# Patient Record
Sex: Male | Born: 1965 | Race: White | Hispanic: No | Smoking: Current every day smoker
Health system: Southern US, Community
[De-identification: ages and names within clinical notes are randomized; demographics above are authoritative.]

## PROBLEM LIST (undated history)

## (undated) DIAGNOSIS — J449 Chronic obstructive pulmonary disease, unspecified: Secondary | ICD-10-CM

## (undated) DIAGNOSIS — K439 Ventral hernia without obstruction or gangrene: Secondary | ICD-10-CM

## (undated) DIAGNOSIS — I10 Essential (primary) hypertension: Secondary | ICD-10-CM

## (undated) DIAGNOSIS — M549 Dorsalgia, unspecified: Secondary | ICD-10-CM

## (undated) DIAGNOSIS — G8929 Other chronic pain: Secondary | ICD-10-CM

## (undated) HISTORY — PX: HERNIA REPAIR: SHX51

---

## 2006-11-29 ENCOUNTER — Emergency Department: Payer: Self-pay

## 2009-04-21 ENCOUNTER — Emergency Department: Payer: Self-pay | Admitting: Emergency Medicine

## 2010-03-28 ENCOUNTER — Ambulatory Visit: Payer: Self-pay | Admitting: Family

## 2010-05-15 ENCOUNTER — Emergency Department: Payer: Self-pay | Admitting: Emergency Medicine

## 2014-09-19 ENCOUNTER — Emergency Department: Payer: Self-pay | Admitting: Emergency Medicine

## 2015-01-14 NOTE — Unmapped External Note (Signed)
 Duplicate

## 2015-01-14 NOTE — Unmapped External Note (Signed)
 Preoperative Diagnosis:    reducible initial inguinal hernia,  Bilateral  Postoperative Diagnosis:    Same, reducible initial inguinal hernia Procedure(s) Performed:    Laparoscopic inguinal hernia repair with prosthetic mesh (TAPP, transabdominal preperitoneal repair), Bilateral   Teaching Surgeon:   Alyce Phlegm, M.D.  Resident Surgeon:   Aimee Kim MD.   Assistants:  None Dictated  Anesthesia:    General.  Specimens:  None.  Estimated Blood Loss:   5 mL  Complications:    None.  Indications for Surgery:   This patient has symptomatic  Bilateral inguinal hernia. A laparoscopic  repair with prosthetic mesh was recommended.   The options of non-operative management, open repair, and laparoscopic repair were discussed with the patient.  The risks of infection, bleeding, recurrence, chronic pain and injury to the intra-abdominal structures were all explained to the patient.  The patient was given the opportunity to answer questions and they were answered.  The patient requested that we proceed with the operation.  Operative Findings: Inguinal hernia, Direct bilateral  Procedure:    A time out was performed upon the patient entering the room.  The correct patient, procedure, and side were verified with the consent and patient. After anesthesia was established, a Foley catheter was placed.  A sterile prep and drape of the abdomen and groin was performed. An incision was made at the umbilicus.  A 12 mm port was introduced using the Hasson technique.  Pneumoperitoneum was established.  A diagnostic laparosocopy revealed no intraabdominal abnormalities besides the hernias described above.  Two 5 mm ports were placed in the mid clavicular lines, taking care to avoid the epigastric.  The peritoneum was then incised on the Right.  The incision started at the ASIS, then was carried anterior and cephalad until at least 4 cm above the hernia defect.  It was carried medially to the median  umbilical fold.  A preperitoneal flap was then developed laterally until the hernia was reached.  A medial dissection was performed until the pubic tubercle and cooper's ligament was identified.  The hernia sac was then reduced and separated from the cord structures.  It was peeled down off of the cord structures until it was clear several centimeters below the internal ring.   A piece of Prolene soft mesh was then brought onto the field and cut to 4 inches by 6 inches.  A tacker was used to anchor the mesh to the posterior aspect of the anterior abdominal wall several centimeters above the defect.  It was also anchored to the pubic tubercle and Cooper's ligament.  A tack was also placed into the inguinal ligament lateral to the defect.   The peritoneum was then closed with absorbable tacks. This procedure was then repeated for the other side.  All trocars with withdrawn.  The fascia at the umbilicus was closed with interrupted 0 Vicryls.   A subcuticular closure and dermabond were used to close the skin.  All of his cord structures were identified and preserved during the case.     Teaching Surgeon Attestation:   I was present for the entire case.

## 2015-09-22 ENCOUNTER — Emergency Department
Admission: EM | Admit: 2015-09-22 | Discharge: 2015-09-22 | Disposition: A | Discharge status: Home / Self Care | Payer: Medicaid Other | Attending: Emergency Medicine | Admitting: Emergency Medicine

## 2015-09-22 ENCOUNTER — Encounter: Payer: Self-pay | Admitting: Emergency Medicine

## 2015-09-22 DIAGNOSIS — R200 Anesthesia of skin: Secondary | ICD-10-CM | POA: Diagnosis not present

## 2015-09-22 DIAGNOSIS — R292 Abnormal reflex: Secondary | ICD-10-CM | POA: Insufficient documentation

## 2015-09-22 DIAGNOSIS — M549 Dorsalgia, unspecified: Secondary | ICD-10-CM | POA: Diagnosis present

## 2015-09-22 DIAGNOSIS — M5489 Other dorsalgia: Secondary | ICD-10-CM | POA: Insufficient documentation

## 2015-09-22 DIAGNOSIS — F17210 Nicotine dependence, cigarettes, uncomplicated: Secondary | ICD-10-CM | POA: Diagnosis not present

## 2015-09-22 DIAGNOSIS — G8929 Other chronic pain: Secondary | ICD-10-CM | POA: Diagnosis not present

## 2015-09-22 DIAGNOSIS — M62838 Other muscle spasm: Secondary | ICD-10-CM | POA: Insufficient documentation

## 2015-09-22 HISTORY — DX: Other chronic pain: G89.29

## 2015-09-22 HISTORY — DX: Chronic obstructive pulmonary disease, unspecified: J44.9

## 2015-09-22 HISTORY — DX: Ventral hernia without obstruction or gangrene: K43.9

## 2015-09-22 HISTORY — DX: Dorsalgia, unspecified: M54.9

## 2015-09-22 MED ORDER — DIAZEPAM 5 MG/ML IJ SOLN
10.0000 mg | Freq: Once | INTRAMUSCULAR | Status: AC
  Administered 2015-09-22: 10 mg via INTRAMUSCULAR
  Filled 2015-09-22: qty 2

## 2015-09-22 MED ORDER — GUAIFENESIN-CODEINE 100-10 MG/5ML PO SOLN: 10.0000 mL | ORAL | Status: DC | PRN

## 2015-09-22 MED ORDER — AZITHROMYCIN 250 MG PO TABS: ORAL_TABLET | ORAL | Status: DC

## 2015-09-22 MED ORDER — HYDROMORPHONE HCL 1 MG/ML IJ SOLN
1.0000 mg | Freq: Once | INTRAMUSCULAR | Status: AC
  Administered 2015-09-22: 1 mg via INTRAMUSCULAR
  Filled 2015-09-22: qty 1

## 2015-09-22 MED ORDER — OXYCODONE-ACETAMINOPHEN 5-325 MG PO TABS: 1.0000 | ORAL_TABLET | ORAL | Status: DC | PRN

## 2015-09-22 MED ORDER — DIAZEPAM 5 MG PO TABS: 5.0000 mg | ORAL_TABLET | Freq: Three times a day (TID) | ORAL | Status: AC | PRN

## 2015-09-22 NOTE — ED Notes (Signed)
Discussed discharge instructions, prescriptions, and follow-up care with patient. No questions or concerns at this time. Pt stable at discharge.  

## 2015-09-22 NOTE — Discharge Instructions (Signed)
Chronic Back Pain  When back pain lasts longer than 3 months, it is called chronic back pain.People with chronic back pain often go through certain periods that are more intense (flare-ups).  CAUSES Chronic back pain can be caused by wear and tear (degeneration) on different structures in your back. These structures include:  The bones of your spine (vertebrae) and the joints surrounding your spinal cord and nerve roots (facets).  The strong, fibrous tissues that connect your vertebrae (ligaments). Degeneration of these structures may result in pressure on your nerves. This can lead to constant pain. HOME CARE INSTRUCTIONS  Avoid bending, heavy lifting, prolonged sitting, and activities which make the problem worse.  Take brief periods of rest throughout the day to reduce your pain. Lying down or standing usually is better than sitting while you are resting.  Take over-the-counter or prescription medicines only as directed by your caregiver. SEEK IMMEDIATE MEDICAL CARE IF:   You have weakness or numbness in one of your legs or feet.  You have trouble controlling your bladder or bowels.  You have nausea, vomiting, abdominal pain, shortness of breath, or fainting.   This information is not intended to replace advice given to you by your health care provider. Make sure you discuss any questions you have with your health care provider.   Document Released: 11/05/2004 Document Revised: 12/21/2011 Document Reviewed: 03/18/2015 Elsevier Interactive Patient Education 2016 [employer]  Lumbosacral Strain Lumbosacral strain is a strain of any of the parts that make up your lumbosacral vertebrae. Your lumbosacral vertebrae are the bones that make up the lower third of your backbone. Your lumbosacral vertebrae are held together by muscles and tough, fibrous tissue (ligaments).  CAUSES  A sudden blow to your back can cause lumbosacral strain. Also, anything that causes an excessive stretch  of the muscles in the low back can cause this strain. This is typically seen when people exert themselves strenuously, fall, lift heavy objects, bend, or crouch repeatedly. RISK FACTORS  Physically demanding work.  Participation in pushing or pulling sports or sports that require a sudden twist of the back (tennis, golf, baseball).  Weight lifting.  Excessive lower back curvature.  Forward-tilted pelvis.  Weak back or abdominal muscles or both.  Tight hamstrings. SIGNS AND SYMPTOMS  Lumbosacral strain may cause pain in the area of your injury or pain that moves (radiates) down your leg.  DIAGNOSIS Your health care provider can often diagnose lumbosacral strain through a physical exam. In some cases, you may need tests such as X-ray exams.  TREATMENT  Treatment for your lower back injury depends on many factors that your clinician will have to evaluate. However, most treatment will include the use of anti-inflammatory medicines. HOME CARE INSTRUCTIONS   Avoid hard physical activities (tennis, racquetball, waterskiing) if you are not in proper physical condition for it. This may aggravate or create problems.  If you have a back problem, avoid sports requiring sudden body movements. Swimming and walking are generally safer activities.  Maintain good posture.  Maintain a healthy weight.  For acute conditions, you may put ice on the injured area.  Put ice in a plastic bag.  Place a towel between your skin and the bag.  Leave the ice on for 20 minutes, 2-3 times a day.  When the low back starts healing, stretching and strengthening exercises may be recommended. SEEK MEDICAL CARE IF:  Your back pain is getting worse.  You experience severe back pain not relieved with medicines.  SEEK IMMEDIATE MEDICAL CARE IF:   You have numbness, tingling, weakness, or problems with the use of your arms or legs.  There is a change in bowel or bladder control.  You have increasing pain in  any area of the body, including your belly (abdomen).  You notice shortness of breath, dizziness, or feel faint.  You feel sick to your stomach (nauseous), are throwing up (vomiting), or become sweaty.  You notice discoloration of your toes or legs, or your feet get very cold. MAKE SURE YOU:   Understand these instructions.  Will watch your condition.  Will get help right away if you are not doing well or get worse.   This information is not intended to replace advice given to you by your health care provider. Make sure you discuss any questions you have with your health care provider.   Document Released: 07/08/2005 Document Revised: 10/19/2014 Document Reviewed: 05/17/2013 Elsevier Interactive Patient Education 2016 [employer]  Radicular Pain Radicular pain in either the arm or leg is usually from a bulging or herniated disk in the spine. A piece of the herniated disk may press against the nerves as the nerves exit the spine. This causes pain which is felt at the tips of the nerves down the arm or leg. Other causes of radicular pain may include:  Fractures.  Heart disease.  Cancer.  An abnormal and usually degenerative state of the nervous system or nerves (neuropathy). Diagnosis may require CT or MRI scanning to determine the primary cause.  Nerves that start at the neck (nerve roots) may cause radicular pain in the outer shoulder and arm. It can spread down to the thumb and fingers. The symptoms vary depending on which nerve root has been affected. In most cases radicular pain improves with conservative treatment. Neck problems may require physical therapy, a neck collar, or cervical traction. Treatment may take many weeks, and surgery may be considered if the symptoms do not improve.  Conservative treatment is also recommended for sciatica. Sciatica causes pain to radiate from the lower back or buttock area down the leg into the foot. Often there is a history of back  problems. Most patients with sciatica are better after 2 to 4 weeks of rest and other supportive care. Short term bed rest can reduce the disk pressure considerably. Sitting, however, is not a good position since this increases the pressure on the disk. You should avoid bending, lifting, and all other activities which make the problem worse. Traction can be used in severe cases. Surgery is usually reserved for patients who do not improve within the first months of treatment. Only take over-the-counter or prescription medicines for pain, discomfort, or fever as directed by your caregiver. Narcotics and muscle relaxants may help by relieving more severe pain and spasm and by providing mild sedation. Cold or massage can give significant relief. Spinal manipulation is not recommended. It can increase the degree of disc protrusion. Epidural steroid injections are often effective treatment for radicular pain. These injections deliver medicine to the spinal nerve in the space between the protective covering of the spinal cord and back bones (vertebrae). Your caregiver can give you more information about steroid injections. These injections are most effective when given within two weeks of the onset of pain.  You should see your caregiver for follow up care as recommended. A program for neck and back injury rehabilitation with stretching and strengthening exercises is an important part of management.  SEEK IMMEDIATE MEDICAL CARE IF:  You develop increased pain, weakness, or numbness in your arm or leg.  You develop difficulty with bladder or bowel control.  You develop abdominal pain.   This information is not intended to replace advice given to you by your health care provider. Make sure you discuss any questions you have with your health care provider.   Document Released: 11/05/2004 Document Revised: 10/19/2014 Document Reviewed: 04/24/2015 Elsevier Interactive Patient Education [employer]

## 2015-09-22 NOTE — ED Provider Notes (Signed)
[facility] Emergency Department Provider Note  ____________________________________________  Time seen: Approximately 12:09 PM  I have reviewed the triage vital signs and the nursing notes.   HISTORY  Chief Complaint Back Pain and Numbness    HPI David Sherman is a 49 y.o. male presents for evaluation of severe back pain with muscle spasms and numbness. Patient states that he has chronic pain and is expected to see the pain management doctor tomorrow. Feels like he cannot wait secondary to the spasms. Feels like his arms jerking his legs are jerking. Medical history significant for chronic back pain.   Past Medical History  Diagnosis Date  . COPD (chronic obstructive pulmonary disease) (HCC)   . Chronic back pain   . Hernia of abdominal wall     There are no active problems to display for this patient.   Past Surgical History  Procedure Laterality Date  . Hernia repair      Current Outpatient Rx  Name  Route  Sig  Dispense  Refill  . diazepam (VALIUM) 5 MG tablet   Oral   Take 1 tablet (5 mg total) by mouth every 8 (eight) hours as needed for muscle spasms.   21 tablet   0   . oxyCODONE-acetaminophen (ROXICET) 5-325 MG tablet   Oral   Take 1-2 tablets by mouth every 4 (four) hours as needed for severe pain.   15 tablet   0     Allergies Review of patient's allergies indicates no known allergies.  History reviewed. No pertinent family history.  Social History Social History  Substance Use Topics  . Smoking status: Current Every Day Smoker -- 0.50 packs/day    Types: Cigarettes  . Smokeless tobacco: Never Used  . Alcohol Use: Yes     Comment: occassionally    Review of Systems Constitutional: No fever/chills Eyes: No visual changes. ENT: No sore throat. Cardiovascular: Denies chest pain. Respiratory: Denies shortness of breath. Gastrointestinal: No abdominal pain.  No nausea, no vomiting.  No diarrhea.  No  constipation. Genitourinary: Negative for dysuria. Musculoskeletal: Positive for back pain. Positive for leg jerking and muscle spasms. Skin: Negative for rash. Neurological: Negative for headaches, focal weakness or numbness.  10-point ROS otherwise negative.  ____________________________________________   PHYSICAL EXAM:  VITAL SIGNS: ED Triage Vitals  Enc Vitals Group     BP 09/22/15 1113 138/93 mmHg     Pulse Rate 09/22/15 1113 79     Resp 09/22/15 1113 18     Temp 09/22/15 1113 98.1 F (36.7 C)     Temp Source 09/22/15 1113 Oral     SpO2 09/22/15 1113 97 %     Weight --      Height --      Head Cir --      Peak Flow --      Pain Score 09/22/15 1116 10     Pain Loc --      Pain Edu? --      Excl. in GC? --     Constitutional: Alert and oriented. Well appearing and in no acute distress. Eyes: Conjunctivae are normal. PERRL. EOMI. Head: Atraumatic. Nose: No congestion/rhinnorhea. Mouth/Throat: Mucous membranes are moist.  Oropharynx non-erythematous. Neck: No stridor.   Cardiovascular: Normal rate, regular rhythm. Grossly normal heart sounds.  Good peripheral circulation. Respiratory: Normal respiratory effort.  No retractions. Lungs CTAB. Gastrointestinal: Soft and nontender. No distention. No abdominal bruits. No CVA tenderness. Musculoskeletal: No lower extremity tenderness nor edema.  No joint effusions. Neurologic:  Normal speech and language. No gross focal neurologic deficits are appreciated. No gait instability. Skin:  Skin is warm, dry and intact. No rash noted. Psychiatric: Mood and affect are normal. Speech and behavior are normal.  ____________________________________________   LABS (all labs ordered are listed, but only abnormal results are displayed)  Labs Reviewed - No data to display ____________________________________________  RADIOLOGY  Deferred at this visit. ____________________________________________   PROCEDURES  Procedure(s)  performed: None  Critical Care performed: No  ____________________________________________   INITIAL IMPRESSION / ASSESSMENT AND PLAN / ED COURSE  Pertinent labs & imaging results that were available during my care of the patient were reviewed by me and considered in my medical decision making (see chart for details).  Acute musculoskeletal muscle spasms of his lower back and legs. Patient states significant improvement with him injection. Patient will follow up with pain management tomorrow as scheduled Rx given for Valium 5 mg 3 times a day. #21 patient voices no other emergency medical complaints at this time. ____________________________________________   FINAL CLINICAL IMPRESSION(S) / ED DIAGNOSES  Final diagnoses:  Muscle spasms of neck  Other back pain      KAHIAU SCHEWE, PA-C 09/22/15 1405  Phineas Semen, MD 09/22/15 [phone]

## 2015-09-22 NOTE — ED Notes (Addendum)
Pt here for back pain with muscle spasms and numbness in left arm.  This is chronic pain. Pt has appointment with pain MD tomorrow. Reports that he feels he cannot wait.

## 2015-10-23 ENCOUNTER — Other Ambulatory Visit: Payer: Self-pay | Admitting: Pediatrics

## 2015-10-23 DIAGNOSIS — R109 Unspecified abdominal pain: Secondary | ICD-10-CM

## 2015-10-28 ENCOUNTER — Ambulatory Visit
Admission: RE | Admit: 2015-10-28 | Discharge: 2015-10-28 | Disposition: A | Discharge status: Home / Self Care | Payer: Medicaid Other | Source: Ambulatory Visit | Attending: Pediatrics | Admitting: Pediatrics

## 2015-10-28 DIAGNOSIS — R109 Unspecified abdominal pain: Secondary | ICD-10-CM | POA: Diagnosis present

## 2015-11-13 ENCOUNTER — Other Ambulatory Visit: Payer: Self-pay | Admitting: Pediatrics

## 2015-11-14 ENCOUNTER — Other Ambulatory Visit: Payer: Self-pay | Admitting: Pediatrics

## 2015-11-14 DIAGNOSIS — F411 Generalized anxiety disorder: Secondary | ICD-10-CM

## 2015-11-14 DIAGNOSIS — R109 Unspecified abdominal pain: Secondary | ICD-10-CM

## 2015-12-03 ENCOUNTER — Ambulatory Visit: Admission: RE | Admit: 2015-12-03 | Payer: Medicaid Other | Source: Ambulatory Visit

## 2016-10-30 ENCOUNTER — Emergency Department: Payer: Medicaid Other

## 2016-10-30 ENCOUNTER — Emergency Department
Admission: EM | Admit: 2016-10-30 | Discharge: 2016-10-31 | Disposition: A | Discharge status: Home / Self Care | Payer: Medicaid Other | Attending: Emergency Medicine | Admitting: Emergency Medicine

## 2016-10-30 ENCOUNTER — Encounter: Payer: Self-pay | Admitting: Emergency Medicine

## 2016-10-30 DIAGNOSIS — F17210 Nicotine dependence, cigarettes, uncomplicated: Secondary | ICD-10-CM | POA: Insufficient documentation

## 2016-10-30 DIAGNOSIS — Y999 Unspecified external cause status: Secondary | ICD-10-CM | POA: Insufficient documentation

## 2016-10-30 DIAGNOSIS — W000XXA Fall on same level due to ice and snow, initial encounter: Secondary | ICD-10-CM | POA: Insufficient documentation

## 2016-10-30 DIAGNOSIS — Y929 Unspecified place or not applicable: Secondary | ICD-10-CM | POA: Diagnosis not present

## 2016-10-30 DIAGNOSIS — R251 Tremor, unspecified: Secondary | ICD-10-CM | POA: Diagnosis not present

## 2016-10-30 DIAGNOSIS — S0181XA Laceration without foreign body of other part of head, initial encounter: Secondary | ICD-10-CM

## 2016-10-30 DIAGNOSIS — Z79899 Other long term (current) drug therapy: Secondary | ICD-10-CM | POA: Insufficient documentation

## 2016-10-30 DIAGNOSIS — R55 Syncope and collapse: Secondary | ICD-10-CM

## 2016-10-30 DIAGNOSIS — S01112A Laceration without foreign body of left eyelid and periocular area, initial encounter: Secondary | ICD-10-CM | POA: Diagnosis not present

## 2016-10-30 DIAGNOSIS — J449 Chronic obstructive pulmonary disease, unspecified: Secondary | ICD-10-CM | POA: Insufficient documentation

## 2016-10-30 DIAGNOSIS — Y939 Activity, unspecified: Secondary | ICD-10-CM | POA: Insufficient documentation

## 2016-10-30 DIAGNOSIS — S0990XA Unspecified injury of head, initial encounter: Secondary | ICD-10-CM | POA: Diagnosis present

## 2016-10-30 LAB — CBC
HCT: 45.4 % (ref 40.0–52.0)
Hemoglobin: 15.7 g/dL (ref 13.0–18.0)
MCH: 33.6 pg (ref 26.0–34.0)
MCHC: 34.5 g/dL (ref 32.0–36.0)
MCV: 97.4 fL (ref 80.0–100.0)
Platelets: 231 10*3/uL (ref 150–440)
RBC: 4.66 MIL/uL (ref 4.40–5.90)
RDW: 13.5 % (ref 11.5–14.5)
WBC: 6.9 10*3/uL (ref 3.8–10.6)

## 2016-10-30 LAB — CK: Total CK: 87 U/L (ref 49–397)

## 2016-10-30 LAB — URINALYSIS, COMPLETE (UACMP) WITH MICROSCOPIC
Bacteria, UA: NONE SEEN
Bilirubin Urine: NEGATIVE
GLUCOSE, UA: NEGATIVE mg/dL
HGB URINE DIPSTICK: NEGATIVE
Ketones, ur: 5 mg/dL — AB
Leukocytes, UA: NEGATIVE
NITRITE: NEGATIVE
Protein, ur: NEGATIVE mg/dL
SPECIFIC GRAVITY, URINE: 1.027 (ref 1.005–1.030)
pH: 5 (ref 5.0–8.0)

## 2016-10-30 LAB — BASIC METABOLIC PANEL
ANION GAP: 4 — AB (ref 5–15)
BUN: 14 mg/dL (ref 6–20)
CALCIUM: 9.2 mg/dL (ref 8.9–10.3)
CO2: 29 mmol/L (ref 22–32)
CREATININE: 1.04 mg/dL (ref 0.61–1.24)
Chloride: 106 mmol/L (ref 101–111)
GFR calc Af Amer: >60 mL/min (ref >60)
GLUCOSE: 106 mg/dL — AB (ref 65–99)
Potassium: 4.6 mmol/L (ref 3.5–5.1)
Sodium: 139 mmol/L (ref 135–145)

## 2016-10-30 LAB — TROPONIN I: Troponin I: <0.03 ng/mL (ref <0.03)

## 2016-10-30 LAB — ETHANOL

## 2016-10-30 MED ORDER — THIAMINE HCL 100 MG/ML IJ SOLN
Freq: Once | INTRAVENOUS | Status: AC
  Administered 2016-10-30: 23:00:00 via INTRAVENOUS
  Filled 2016-10-30 (×2): qty 1000

## 2016-10-30 MED ORDER — BACITRACIN ZINC 500 UNIT/GM EX OINT
TOPICAL_OINTMENT | CUTANEOUS | Status: AC
  Filled 2016-10-30: qty 0.9

## 2016-10-30 MED ORDER — LORAZEPAM 2 MG/ML IJ SOLN
1.0000 mg | Freq: Once | INTRAMUSCULAR | Status: AC
  Administered 2016-10-30: 1 mg via INTRAVENOUS
  Filled 2016-10-30: qty 1

## 2016-10-30 NOTE — ED Provider Notes (Signed)
[facility] Emergency Department Provider Note  ____________________________________________   First MD Initiated Contact with Patient 10/30/16 2112     (approximate)  I have reviewed the triage vital signs and the nursing notes.   HISTORY  Chief Complaint Loss of Consciousness and Hand Pain   HPI David Sherman is a 51 y.o. male with a history of chronic back pain as well as COPD who is presenting to the emergency department with aching and shaking to his bilateral hands.  He said that 2 days ago he went out in the snow and slipped and fell. He said that he came back inside and was feeling nauseous and then felt lightheaded and fell forward. He denies losing consciousness. He says that he is coming to the emergency department today because ever since that he had this episode he has felt shaking in his hands along with cramping. He says that when he fell, he fell onto the wall and snapped his head backward and felt like he may have broken his neck at that time as well. He does not report any weakness. Says that he has had no MRI with arthritis in the past in his neck.  Although the triage note says that he felt the patient denies any falls today. He says that he also stated lacerations 2 days ago but did not have them addressed. He has placed tape on them. He says that he has had a tetanus shot within the last 2 years.   Past Medical History:  Diagnosis Date  . Chronic back pain   . COPD (chronic obstructive pulmonary disease) (HCC)   . Hernia of abdominal wall     There are no active problems to display for this patient.   Past Surgical History:  Procedure Laterality Date  . HERNIA REPAIR      Prior to Admission medications   Medication Sig Start Date End Date Taking? Authorizing Provider  oxyCODONE-acetaminophen (ROXICET) 5-325 MG tablet Take 1-2 tablets by mouth every 4 (four) hours as needed for severe pain. 09/22/15   Evangeline Dakin, PA-C      Allergies Patient has no known allergies.  No family history on file.  Social History Social History  Substance Use Topics  . Smoking status: Current Every Day Smoker    Packs/day: 0.50    Types: Cigarettes  . Smokeless tobacco: Never Used  . Alcohol use Yes     Comment: occassionally    Review of Systems Constitutional: No fever/chills Eyes: No visual changes. ENT: No sore throat. Cardiovascular: Denies chest pain. Respiratory: Denies shortness of breath. Gastrointestinal: No abdominal pain.  no vomiting.  No diarrhea.  No constipation. Genitourinary: Negative for dysuria. Musculoskeletal: Negative for back pain. Skin: Negative for rash. Neurological: Negative for headaches, focal weakness or numbness.  10-point ROS otherwise negative.  ____________________________________________   PHYSICAL EXAM:  VITAL SIGNS: ED Triage Vitals [10/30/16 1631]  Enc Vitals Group     BP (!) 155/102     Pulse Rate 74     Resp 16     Temp 98.4 F (36.9 C)     Temp Source Oral     SpO2 99 %     Weight 140 lb (63.5 kg)     Height 6\' 1"  (1.854 m)     Head Circumference      Peak Flow      Pain Score 8     Pain Loc      Pain Edu?  Excl. in GC?     Constitutional: Alert and oriented. Well appearing and in no acute distress. Eyes: Conjunctivae are normal. PERRL. EOMI. Head: Laceration over the medial left eyebrow which is curvilinear 3 cm. There is no active bleeding. No starting induration or pus. No tenderness palpation or depressions. One set meter laceration to the right inferior chin without any bleeding, induration or pus. No tenderness to palpation. No trismus. Nose: No congestion/rhinnorhea. Mouth/Throat: Mucous membranes are moist.  Neck: No stridor.  No tenderness to the posterior cervical spine. No deformity or step-off. Cardiovascular: Normal rate, regular rhythm. Grossly normal heart sounds.   Respiratory: Normal respiratory effort.  No retractions. Lungs  CTAB. Gastrointestinal: Soft and nontender. No distention.  Musculoskeletal: No lower extremity tenderness nor edema.  No joint effusions. Neurologic:  Normal speech and language. No gross focal neurologic deficits are appreciated. Sensation is intact to bilateral upper extremity is. 5/5 strength to the bilateral upper as well as lower  Extremities. No sensory deficits. No deformities. Mild tremor. Skin:  Skin is warm, dry and intact. No rash noted. Psychiatric: Mood and affect are normal. Speech and behavior are normal.  ____________________________________________   LABS (all labs ordered are listed, but only abnormal results are displayed)  Labs Reviewed  BASIC METABOLIC PANEL - Abnormal; Notable for the following:       Result Value   Glucose, Bld 106 (*)    Anion gap 4 (*)    All other components within normal limits  URINALYSIS, COMPLETE (UACMP) WITH MICROSCOPIC - Abnormal; Notable for the following:    Color, Urine YELLOW (*)    APPearance CLEAR (*)    Ketones, ur 5 (*)    Squamous Epithelial / LPF 0-5 (*)    All other components within normal limits  CBC  TROPONIN I  CK  ETHANOL  CBG MONITORING, ED   ____________________________________________  EKG  ED ECG REPORT I, Arelia Longest, the attending physician, personally viewed and interpreted this ECG.   Date: 10/31/2016  EKG Time: 1635  Rate: 70  Rhythm: normal sinus rhythm  Axis: Normal  Intervals:none  ST&T Change: No ST segment elevation or depression. No abnormal T-wave inversion.  ____________________________________________  RADIOLOGY  CT Cervical Spine Wo Contrast (Accession [id number]) (Order [id number])  Imaging  Date: 10/30/2016 Department: [facility] EMERGENCY DEPARTMENT Released By/Authorizing: Myrna Blazer, MD (auto-released)  Exam Information   Status Exam Begun  Exam Ended   Final [99] 10/30/2016 10:02 PM 10/30/2016 10:06 PM  PACS Images   Show images  for CT Cervical Spine Wo Contrast  Study Result   CLINICAL DATA:  Patient fell in shower as well as outside in the snow. No  EXAM: CT HEAD WITHOUT CONTRAST  CT CERVICAL SPINE WITHOUT CONTRAST  TECHNIQUE: Multidetector CT imaging of the head and cervical spine was performed following the standard protocol without intravenous contrast. Multiplanar CT image reconstructions of the cervical spine were also generated.  COMPARISON:  MRI cervical spine 03/16/2014  FINDINGS: CT HEAD FINDINGS  Brain: Mild superficial atrophy. No acute intracranial hemorrhage, midline shift or edema. No intra-axial mass nor extra-axial fluid collections.  Vascular: Minimal carotid siphon calcifications bilaterally.  Skull: No skull fracture nor bone destruction.  Sinuses/Orbits: Partial opacification of the right mastoids. Clear left mastoids. Paranasal sinuses are unremarkable. Orbits are symmetric in appearance.  Other: None  CT CERVICAL SPINE FINDINGS  Alignment: Straightening of cervical lordosis. Intact craniocervical relationship and atlantodental interval.  Skull base and vertebrae: No  fracture the skullbase nor vertebral body fracture. Alignment of the facets bilaterally. Osteophytes are seen from C3 through C6 anteriorly and posteriorly at C4-5. There is uncovertebral joint hypertrophy and osteoarthritis at C4-5 and C5-6 bilaterally. Mild levoconvex curvature of the cervical spine is seen.  Soft tissues and spinal canal: No prevertebral soft tissue swelling. No intraspinal hematoma.  Disc levels:  C2-C3:  Negative  C3-C4:  Negative  C4-C5: Central to right-sided eccentric mild-to-moderate disc bulge with osteophytes. Moderate right and mild left foraminal stenosis secondary to uncovertebral joint spurring and endplate osteophytes.  C5-C6: Mild disc bulge without focal disc herniation. Mild-to-moderate bilateral neural foraminal encroachment  from uncovertebral spurring.  C6-C7:  Negative  C7-T1:  Negative  T1-2: Negative  T2-3: Negative  Upper chest: Minimal central line emphysema. Probable pleuroparenchymal areas of scarring left-greater-than-right.  Other: None  IMPRESSION: No acute intracranial abnormality.  No acute cervical spine fracture. C4-5 mild to moderate broad-based central to slightly right-sided disc bulge. C5-6 mild disc bulge. Associated neural foraminal encroachment as above. 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   Electronically Signed   By: Tollie Ethavid  Kwon M.D.   On: 10/30/2016 22:36     ____________________________________________   PROCEDURES  Procedure(s) performed:   Procedures  Critical Care performed:   ____________________________________________   INITIAL IMPRESSION / ASSESSMENT AND PLAN / ED COURSE  Pertinent labs & imaging results that were available during my care of the patient were reviewed by me and considered in my medical decision making (see chart for details).  ----------------------------------------- 12:06 AM on 10/31/2016 -----------------------------------------  Patient with reassuring workup. Feels improved after fluids and Ativan. Patient given banana bag. Says that he does not drink every day. No cervical spine fracture. Arthritic changes as well as mild disc bulges. Reviewed his images with the patient. He will follow-up with his primary care doctor. Will be discharged home. Sounds like he had a single episode secondary to vagal response from nausea. He sustained lacerations which we would, if presenting under 24 hours, would've sutured. Instead, his lacerations were cleaned. Bacitracin was applied. And they were Steri-Stripped. Unclear cause of the tremor. Patient says he only drinks occasionally. 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 Due to the patient's neck pain as well as upper extremity symptoms he will be given a  prescription for ipratropium, 600 mg, every 6 over the next 3 days. He has no sensory deficits. No strength deficits. Do not feel that MRI is indicated at this time.      ____________________________________________   FINAL CLINICAL IMPRESSION(S) / ED DIAGNOSES  Near-syncope. Tremor. Facial lacerations. Nausea.    NEW MEDICATIONS STARTED DURING THIS VISIT:  New Prescriptions   No medications on file     Note:  This document was prepared using Dragon voice recognition software and may include unintentional dictation errors.    Myrna Blazeravid Matthew Tilley Faeth, MD 10/31/16 [id number]

## 2016-10-30 NOTE — ED Triage Notes (Signed)
Pt reports falling today in snow and then fallling today in shower. Pt with chronic back pain.

## 2016-10-30 NOTE — ED Triage Notes (Signed)
Larey SeatFell a couple of days ago.  States felt dizzy after trying to vomit and fell forward.  Today c/o hand pain and laceration to right forehead, from 2 days ago.

## 2016-10-31 MED ORDER — IBUPROFEN 600 MG PO TABS: 600.0000 mg | ORAL_TABLET | Freq: Four times a day (QID) | ORAL | 0 refills | Status: DC

## 2019-03-10 ENCOUNTER — Emergency Department: Payer: Medicaid Other

## 2019-03-10 ENCOUNTER — Other Ambulatory Visit: Payer: Self-pay

## 2019-03-10 ENCOUNTER — Observation Stay
Admission: EM | Admit: 2019-03-10 | Discharge: 2019-03-12 | Disposition: A | Discharge status: Home / Self Care | Payer: Medicaid Other | Attending: Internal Medicine | Admitting: Internal Medicine

## 2019-03-10 ENCOUNTER — Encounter: Payer: Self-pay | Admitting: Emergency Medicine

## 2019-03-10 DIAGNOSIS — J449 Chronic obstructive pulmonary disease, unspecified: Secondary | ICD-10-CM | POA: Insufficient documentation

## 2019-03-10 DIAGNOSIS — F17210 Nicotine dependence, cigarettes, uncomplicated: Secondary | ICD-10-CM | POA: Diagnosis not present

## 2019-03-10 DIAGNOSIS — M6281 Muscle weakness (generalized): Secondary | ICD-10-CM | POA: Insufficient documentation

## 2019-03-10 DIAGNOSIS — R296 Repeated falls: Secondary | ICD-10-CM | POA: Diagnosis not present

## 2019-03-10 DIAGNOSIS — R001 Bradycardia, unspecified: Secondary | ICD-10-CM | POA: Diagnosis not present

## 2019-03-10 DIAGNOSIS — Z7951 Long term (current) use of inhaled steroids: Secondary | ICD-10-CM | POA: Insufficient documentation

## 2019-03-10 DIAGNOSIS — F329 Major depressive disorder, single episode, unspecified: Secondary | ICD-10-CM | POA: Insufficient documentation

## 2019-03-10 DIAGNOSIS — K219 Gastro-esophageal reflux disease without esophagitis: Secondary | ICD-10-CM | POA: Diagnosis not present

## 2019-03-10 DIAGNOSIS — G8929 Other chronic pain: Secondary | ICD-10-CM | POA: Insufficient documentation

## 2019-03-10 DIAGNOSIS — I10 Essential (primary) hypertension: Secondary | ICD-10-CM | POA: Diagnosis not present

## 2019-03-10 DIAGNOSIS — E876 Hypokalemia: Secondary | ICD-10-CM | POA: Insufficient documentation

## 2019-03-10 DIAGNOSIS — Z1159 Encounter for screening for other viral diseases: Secondary | ICD-10-CM | POA: Diagnosis not present

## 2019-03-10 DIAGNOSIS — Z79899 Other long term (current) drug therapy: Secondary | ICD-10-CM | POA: Insufficient documentation

## 2019-03-10 DIAGNOSIS — R2689 Other abnormalities of gait and mobility: Secondary | ICD-10-CM | POA: Diagnosis not present

## 2019-03-10 DIAGNOSIS — I959 Hypotension, unspecified: Principal | ICD-10-CM

## 2019-03-10 DIAGNOSIS — R197 Diarrhea, unspecified: Secondary | ICD-10-CM | POA: Insufficient documentation

## 2019-03-10 DIAGNOSIS — R7989 Other specified abnormal findings of blood chemistry: Secondary | ICD-10-CM

## 2019-03-10 HISTORY — DX: Essential (primary) hypertension: I10

## 2019-03-10 LAB — LACTIC ACID, PLASMA
Lactic Acid, Venous: 3.9 mmol/L (ref 0.5–1.9)
Lactic Acid, Venous: 2.1 mmol/L (ref 0.5–1.9)
Lactic Acid, Venous: 1.3 mmol/L (ref 0.5–1.9)

## 2019-03-10 LAB — CBC WITH DIFFERENTIAL/PLATELET
Abs Immature Granulocytes: 0.02 10*3/uL (ref 0.00–0.07)
Basophils Absolute: 0 10*3/uL (ref 0.0–0.1)
Basophils Relative: 1 %
Eosinophils Absolute: 0.2 10*3/uL (ref 0.0–0.5)
Eosinophils Relative: 4 %
HCT: 34.7 % — ABNORMAL LOW (ref 39.0–52.0)
Hemoglobin: 12.6 g/dL — ABNORMAL LOW (ref 13.0–17.0)
Immature Granulocytes: 0 %
Lymphocytes Relative: 45 %
Lymphs Abs: 2.2 10*3/uL (ref 0.7–4.0)
MCH: 36.3 pg — ABNORMAL HIGH (ref 26.0–34.0)
MCHC: 36.3 g/dL — ABNORMAL HIGH (ref 30.0–36.0)
MCV: 100 fL (ref 80.0–100.0)
Monocytes Absolute: 0.8 10*3/uL (ref 0.1–1.0)
Monocytes Relative: 15 %
Neutro Abs: 1.7 10*3/uL (ref 1.7–7.7)
Neutrophils Relative %: 35 %
Platelets: 173 10*3/uL (ref 150–400)
RBC: 3.47 MIL/uL — ABNORMAL LOW (ref 4.22–5.81)
RDW: 12.4 % (ref 11.5–15.5)
WBC: 5 10*3/uL (ref 4.0–10.5)
nRBC: 0 % (ref 0.0–0.2)

## 2019-03-10 LAB — COMPREHENSIVE METABOLIC PANEL
ALT: 32 U/L (ref 0–44)
AST: 59 U/L — ABNORMAL HIGH (ref 15–41)
Albumin: 2.8 g/dL — ABNORMAL LOW (ref 3.5–5.0)
Alkaline Phosphatase: 81 U/L (ref 38–126)
Anion gap: 10 (ref 5–15)
BUN: 7 mg/dL (ref 6–20)
CO2: 23 mmol/L (ref 22–32)
Calcium: 8 mg/dL — ABNORMAL LOW (ref 8.9–10.3)
Chloride: 101 mmol/L (ref 98–111)
Creatinine, Ser: 0.97 mg/dL (ref 0.61–1.24)
GFR calc Af Amer: >60 mL/min (ref >60)
GFR calc non Af Amer: >60 mL/min (ref >60)
Glucose, Bld: 74 mg/dL (ref 70–99)
Potassium: 3.8 mmol/L (ref 3.5–5.1)
Sodium: 134 mmol/L — ABNORMAL LOW (ref 135–145)
Total Bilirubin: 0.7 mg/dL (ref 0.3–1.2)
Total Protein: 5.3 g/dL — ABNORMAL LOW (ref 6.5–8.1)

## 2019-03-10 LAB — URINALYSIS, COMPLETE (UACMP) WITH MICROSCOPIC
Bilirubin Urine: NEGATIVE
Glucose, UA: NEGATIVE mg/dL
Hgb urine dipstick: NEGATIVE
Ketones, ur: NEGATIVE mg/dL
Leukocytes,Ua: NEGATIVE
Nitrite: NEGATIVE
Protein, ur: NEGATIVE mg/dL
Specific Gravity, Urine: 1.006 (ref 1.005–1.030)
pH: 6 (ref 5.0–8.0)

## 2019-03-10 LAB — TROPONIN I
Troponin I: <0.03 ng/mL (ref <0.03)
Troponin I: <0.03 ng/mL (ref <0.03)

## 2019-03-10 LAB — SARS CORONAVIRUS 2 BY RT PCR (HOSPITAL ORDER, PERFORMED IN ~~LOC~~ HOSPITAL LAB): SARS Coronavirus 2: NEGATIVE

## 2019-03-10 MED ORDER — ENOXAPARIN SODIUM 40 MG/0.4ML ~~LOC~~ SOLN
40.0000 mg | SUBCUTANEOUS | Status: DC
  Administered 2019-03-10 – 2019-03-11 (×2): 40 mg via SUBCUTANEOUS
  Filled 2019-03-10 (×2): qty 0.4

## 2019-03-10 MED ORDER — ONDANSETRON HCL 4 MG/2ML IJ SOLN: 4.0000 mg | Freq: Four times a day (QID) | INTRAMUSCULAR | Status: DC | PRN

## 2019-03-10 MED ORDER — SODIUM CHLORIDE 0.9 % IV BOLUS
1000.0000 mL | Freq: Once | INTRAVENOUS | Status: AC
  Administered 2019-03-10: 1000 mL via INTRAVENOUS

## 2019-03-10 MED ORDER — CITALOPRAM HYDROBROMIDE 20 MG PO TABS
20.0000 mg | ORAL_TABLET | Freq: Every day | ORAL | Status: DC
  Administered 2019-03-11 – 2019-03-12 (×2): 20 mg via ORAL
  Filled 2019-03-10 (×2): qty 1

## 2019-03-10 MED ORDER — MOMETASONE FURO-FORMOTEROL FUM 200-5 MCG/ACT IN AERO
2.0000 | INHALATION_SPRAY | Freq: Two times a day (BID) | RESPIRATORY_TRACT | Status: DC
  Administered 2019-03-10 – 2019-03-12 (×4): 2 via RESPIRATORY_TRACT
  Filled 2019-03-10: qty 8.8

## 2019-03-10 MED ORDER — ONDANSETRON HCL 4 MG PO TABS: 4.0000 mg | ORAL_TABLET | Freq: Four times a day (QID) | ORAL | Status: DC | PRN

## 2019-03-10 MED ORDER — SODIUM CHLORIDE 0.9 % IV BOLUS
1000.0000 mL | Freq: Once | INTRAVENOUS | Status: AC
  Administered 2019-03-10: 16:00:00 1000 mL via INTRAVENOUS

## 2019-03-10 MED ORDER — ACETAMINOPHEN 325 MG PO TABS
650.0000 mg | ORAL_TABLET | Freq: Four times a day (QID) | ORAL | Status: DC | PRN
  Administered 2019-03-11: 650 mg via ORAL
  Filled 2019-03-10: qty 2

## 2019-03-10 MED ORDER — PANTOPRAZOLE SODIUM 40 MG PO TBEC
80.0000 mg | DELAYED_RELEASE_TABLET | Freq: Every day | ORAL | Status: DC
  Administered 2019-03-11 – 2019-03-12 (×2): 80 mg via ORAL
  Filled 2019-03-10 (×2): qty 2

## 2019-03-10 MED ORDER — SODIUM CHLORIDE 0.9 % IV SOLN
INTRAVENOUS | Status: DC
  Administered 2019-03-10 – 2019-03-12 (×4): via INTRAVENOUS

## 2019-03-10 MED ORDER — ALBUTEROL SULFATE (2.5 MG/3ML) 0.083% IN NEBU: 2.5000 mg | INHALATION_SOLUTION | RESPIRATORY_TRACT | Status: DC | PRN

## 2019-03-10 MED ORDER — ACETAMINOPHEN 650 MG RE SUPP: 650.0000 mg | Freq: Four times a day (QID) | RECTAL | Status: DC | PRN

## 2019-03-10 NOTE — H&P (Signed)
[employer] -  at [facility]   PATIENT NAME: David Sherman    MR#:  [id number]  DATE OF BIRTH:  11/05/1965  DATE OF ADMISSION:  03/10/2019  PRIMARY CARE PHYSICIAN: Orene Desanctis, MD   REQUESTING/REFERRING PHYSICIAN: Dorothea Glassman, MD  CHIEF COMPLAINT:   Chief Complaint  Patient presents with  . Weakness  . Hypotension    HISTORY OF PRESENT ILLNESS:  David Sherman  is a 53 y.o. male with a known history of COPD and hypertension who presented to the ED with generalized weakness and lightheadedness.  Patient states that starting this morning, he was unable to stand up without becoming very dizzy and feeling like he was going to pass out.  He also endorses generalized weakness.  He denies any chest pain or shortness of breath.  He endorses 5-6 episodes of diarrhea daily since April when he broke his jaw.  He denies any nausea, vomiting, or abdominal pain.  He denies any fevers or chills.  In the ED, he was noted to be hypotensive with BPs as low as 81/50.  He was also bradycardic with heart rates in the mid 50s.  Labs were significant for lactic acid 3.9.  Hemoglobin 12.6.  Chest x-ray negative.  Patient was given 2 L normal saline bolus with improvement in blood pressures.  Hospitalists were called for admission.  PAST MEDICAL HISTORY:   Past Medical History:  Diagnosis Date  . Chronic back pain   . COPD (chronic obstructive pulmonary disease) (HCC)   . Hernia of abdominal wall   . Hypertension     PAST SURGICAL HISTORY:   Past Surgical History:  Procedure Laterality Date  . HERNIA REPAIR      SOCIAL HISTORY:   Social History   Tobacco Use  . Smoking status: Current Every Day Smoker    Packs/day: 0.50    Types: Cigarettes  . Smokeless tobacco: Never Used  Substance Use Topics  . Alcohol use: Yes    Comment: occassionally    FAMILY HISTORY:  Father-heart disease  DRUG ALLERGIES:  No Known Allergies  REVIEW OF SYSTEMS:    Review of Systems  Constitutional: Negative for chills and fever.  HENT: Negative for congestion and sore throat.   Eyes: Negative for blurred vision and double vision.  Respiratory: Negative for cough and shortness of breath.   Cardiovascular: Negative for chest pain and palpitations.  Gastrointestinal: Positive for abdominal pain. Negative for blood in stool, melena, nausea and vomiting.  Genitourinary: Negative for dysuria and urgency.  Musculoskeletal: Negative for back pain and neck pain.  Neurological: Positive for dizziness and weakness. Negative for tingling, speech change, focal weakness and headaches.  Psychiatric/Behavioral: Negative for depression. The patient is not nervous/anxious.     MEDICATIONS AT HOME:   Prior to Admission medications   Medication Sig Start Date End Date Taking? Authorizing Provider  albuterol (VENTOLIN HFA) 108 (90 Base) MCG/ACT inhaler Inhale 2 puffs into the lungs every 4 (four) hours as needed for wheezing.   Yes [provider]  ascorbic acid (VITAMIN C) 500 MG tablet Take 1,000 mg by mouth daily. 03/03/19 03/02/20 Yes [provider]  budesonide-formoterol (SYMBICORT) 160-4.5 MCG/ACT inhaler Inhale 2 puffs into the lungs 2 (two) times a day. 11/15/18 11/15/19 Yes [provider]  citalopram (CELEXA) 20 MG tablet Take 20 mg by mouth daily. 01/03/19  Yes [provider]  lisinopril (ZESTRIL) 10 MG tablet Take 10 mg by mouth daily.   Yes [provider]  omeprazole (PRILOSEC) 40 MG capsule Take 40 mg by mouth daily.   Yes [provider]  propranolol (INDERAL) 20 MG tablet Take 20 mg by mouth 4 (four) times daily as needed for anxiety or tremors. 03/03/19 03/02/20 Yes [provider]  rOPINIRole (REQUIP) 0.5 MG tablet Take 0.5 mg by mouth 3 (three) times daily. 02/02/19  Yes [provider]  tiZANidine (ZANAFLEX) 4 MG tablet Take 2-4 mg by mouth 3 (three) times daily as needed for muscle  spasms.   Yes [provider]      VITAL SIGNS:  Blood pressure 90/60, pulse (!) 55, temperature 97.6 F (36.4 C), temperature source Oral, resp. rate 16, weight 63.5 kg, SpO2 98 %.  PHYSICAL EXAMINATION:  Physical Exam  GENERAL:  53 y.o.-year-old patient lying in the bed with no acute distress.  EYES: Pupils equal, round, reactive to light and accommodation. No scleral icterus. Extraocular muscles intact.  HEENT: Head atraumatic, normocephalic. Oropharynx and nasopharynx clear.  NECK:  Supple, no jugular venous distention. No thyroid enlargement, no tenderness.  LUNGS: Normal breath sounds bilaterally, no wheezing, rales,rhonchi or crepitation. No use of accessory muscles of respiration.  CARDIOVASCULAR: Bradycardic, regular rhythm, S1, S2 normal. No murmurs, rubs, or gallops.  ABDOMEN: Soft, nontender, nondistended. Bowel sounds present. No organomegaly or mass.  EXTREMITIES: No pedal edema, cyanosis, or clubbing.  NEUROLOGIC: Cranial nerves II through XII are intact. + Global weakness. Sensation intact. Gait not checked.  PSYCHIATRIC: The patient is alert and oriented x 3.  SKIN: No obvious rash, lesion, or ulcer.   LABORATORY PANEL:   CBC Recent Labs  Lab 03/10/19 1144  WBC 5.0  HGB 12.6*  HCT 34.7*  PLT 173   ------------------------------------------------------------------------------------------------------------------  Chemistries  Recent Labs  Lab 03/10/19 1144  NA 134*  K 3.8  CL 101  CO2 23  GLUCOSE 74  BUN 7  CREATININE 0.97  CALCIUM 8.0*  AST 59*  ALT 32  ALKPHOS 81  BILITOT 0.7   ------------------------------------------------------------------------------------------------------------------  Cardiac Enzymes Recent Labs  Lab 03/10/19 1144  TROPONINI <0.03   ------------------------------------------------------------------------------------------------------------------  RADIOLOGY:  Dg Chest Portable 1 View  Result Date:  03/10/2019 CLINICAL DATA:  Generalized weakness. EXAM: PORTABLE CHEST 1 VIEW COMPARISON:  06/06/2014. FINDINGS: Mediastinum hilar structures normal. Lungs are clear. Calcified pulmonary nodules consistent with granulomas. Heart size normal. No pleural effusion or pneumothorax. Degenerative change thoracic spine. IMPRESSION: No acute cardiopulmonary disease. Electronically Signed   By: Maisie Fus  Register   On: 03/10/2019 12:42      IMPRESSION AND PLAN:   Hypotension- likely due to dehydration in the setting of diarrhea as well as recently starting propranolol 4 times daily 1 week ago for tremor/anxiety. Also recently started on requip ~4 weeks ago. -s/p 2L NS bolus in the ED. Will give another bolus and start on IV fluids. -Hold propranolol, Requip, lisinopril, and zanaflex -Check orthostatic vitals tomorrow morning -Obtain ECHO (KC to read) -PT consult  Symptomatic bradycardia- likely due to recently starting propranolol -Stop propranolol -Check TSH -Cardiac monitoring  Lactic acidosis- may be related to hypotension. No signs of sepsis. -IVFs -Trend lactic acid  Diarrhea- has been going on for about 1 month. -Check GI pathogen panel and C. Diff -Consider GI consult if no improvement  COPD- stable, no signs of acute exacerbation - Continue home inhalers  Depression/anxiety- stable -Continue home Celexa  Chronic back pain -Holding home Zanaflex  All the records are reviewed and case discussed with ED provider. Management plans discussed  with the patient, family and they are in agreement.  CODE STATUS: Full  TOTAL TIME TAKING CARE OF THIS PATIENT: 45 minutes.    Jinny BlossomKaty D Alysson Geist M.D on 03/10/2019 at 2:23 PM  Between 7am to 6pm - Pager [phone]- [phone]  After 6pm go to [email or web address] - Social research officer, governmentpassword [email or web address] [email or web address]  [facility] [place] [facility]  Office  [phone]  CC: Primary care physician; Orene DesanctisBehling, Karen, MD   Note: This dictation was prepared with Dragon dictation along  with smaller phrase technology. Any transcriptional errors that result from this process are unintentional.

## 2019-03-10 NOTE — ED Triage Notes (Signed)
EMS called to residence for c/o weakness.l  EMS reports patient taking home medication this morning and felt weak and ill after taking.  Family called EMS at around 1030.  On EMS arrival SBP:  70's.  400 cc NS given.

## 2019-03-10 NOTE — ED Notes (Signed)
AAOx3  skin warm and dry. NAD °

## 2019-03-10 NOTE — ED Provider Notes (Signed)
[facility] Emergency Department Provider Note   ____________________________________________   First MD Initiated Contact with Patient 03/10/19 1145     (approximate)  I have reviewed the triage vital signs and the nursing notes.   HISTORY  Chief Complaint Weakness and Hypotension    HPI David Sherman is a 53 y.o. male who reports he broke his jaw on the seventh of last month.  He has been treated for it since then.  He reports he has been feeling weak and lightheaded since then.  Today he took his medicines and was much much weaker than usual.  He also reports he has been having diarrhea watery stools 5 or 6 times a day since he broke his jaw.  He does not describe bloody or black stool.  EMS initially called a STEMI.  Dr. and came in and reviewed the EKG with me.  He does not feel it is a STEMI.  Patient has no chest pain or other discomfort or shortness of breath.         Past Medical History:  Diagnosis Date  . Chronic back pain   . COPD (chronic obstructive pulmonary disease) (HCC)   . Hernia of abdominal wall   . Hypertension     There are no active problems to display for this patient.   Past Surgical History:  Procedure Laterality Date  . HERNIA REPAIR      Prior to Admission medications   Medication Sig Start Date End Date Taking? Authorizing Provider  albuterol (VENTOLIN HFA) 108 (90 Base) MCG/ACT inhaler Inhale 2 puffs into the lungs every 4 (four) hours as needed for wheezing.   Yes [provider]  ascorbic acid (VITAMIN C) 500 MG tablet Take 1,000 mg by mouth daily. 03/03/19 03/02/20 Yes [provider]  budesonide-formoterol (SYMBICORT) 160-4.5 MCG/ACT inhaler Inhale 2 puffs into the lungs 2 (two) times a day. 11/15/18 11/15/19 Yes [provider]  citalopram (CELEXA) 20 MG tablet Take 20 mg by mouth daily. 01/03/19  Yes [provider]  lisinopril (ZESTRIL) 10 MG tablet Take 10 mg by mouth  daily.   Yes [provider]  omeprazole (PRILOSEC) 40 MG capsule Take 40 mg by mouth 2 (two) times a day.    Yes [provider]  propranolol (INDERAL) 20 MG tablet Take 20 mg by mouth 4 (four) times daily as needed for anxiety or tremors. 03/03/19 03/02/20 Yes [provider]  rOPINIRole (REQUIP) 0.5 MG tablet Take 0.5 mg by mouth 3 (three) times daily. 02/02/19  Yes [provider]  tiZANidine (ZANAFLEX) 4 MG tablet Take 2-4 mg by mouth 3 (three) times daily as needed for muscle spasms.   Yes [provider]    Allergies Patient has no known allergies.  No family history on file.  Social History Social History   Tobacco Use  . Smoking status: Current Every Day Smoker    Packs/day: 0.50    Types: Cigarettes  . Smokeless tobacco: Never Used  Substance Use Topics  . Alcohol use: Yes    Comment: occassionally  . Drug use: No    Review of Systems  Constitutional: No fever/chills Eyes: No visual changes. ENT: No sore throat. Cardiovascular: Denies chest pain. Respiratory: Denies shortness of breath. Gastrointestinal: No abdominal pain.  No nausea, no vomiting.   diarrhea.  No constipation. Genitourinary: Negative for dysuria. Musculoskeletal: Negative for back pain. Skin: Negative for rash. Neurological: Negative for headaches, focal weakness  ____________________________________________  PHYSICAL EXAM:  VITAL SIGNS: ED Triage Vitals  Enc Vitals Group     BP 03/10/19 1144 91/67     Pulse Rate 03/10/19 1144 (!) 56     Resp 03/10/19 1144 16     Temp 03/10/19 1144 97.6 F (36.4 C)     Temp Source 03/10/19 1144 Oral     SpO2 --      Weight 03/10/19 1141 139 lb 15.9 oz (63.5 kg)     Height --      Head Circumference --      Peak Flow --      Pain Score 03/10/19 1141 0     Pain Loc --      Pain Edu? --      Excl. in GC? --     Constitutional: Alert and oriented. Well appearing and in no acute distress. Eyes:  Conjunctivae are normal.  Head: Atraumatic. Nose: No congestion/rhinnorhea. Mouth/Throat: Mucous membranes are moist.  Oropharynx non-erythematous. Neck: No stridor. Cardiovascular: Normal rate, regular rhythm. Grossly normal heart sounds.  Good peripheral circulation. Respiratory: Normal respiratory effort.  No retractions. Lungs CTAB. Gastrointestinal: Soft and nontender. No distention. No abdominal bruits. No CVA tenderness. Musculoskeletal: No lower extremity tenderness nor edema.  . Neurologic:  Normal speech and language. No gross focal neurologic deficits are appreciated. No gait instability. Skin:  Skin is warm, dry and intact. No rash noted.   ____________________________________________   LABS (all labs ordered are listed, but only abnormal results are displayed)  Labs Reviewed  COMPREHENSIVE METABOLIC PANEL - Abnormal; Notable for the following components:      Result Value   Sodium 134 (*)    Calcium 8.0 (*)    Total Protein 5.3 (*)    Albumin 2.8 (*)    AST 59 (*)    All other components within normal limits  CBC WITH DIFFERENTIAL/PLATELET - Abnormal; Notable for the following components:   RBC 3.47 (*)    Hemoglobin 12.6 (*)    HCT 34.7 (*)    MCH 36.3 (*)    MCHC 36.3 (*)    All other components within normal limits  LACTIC ACID, PLASMA - Abnormal; Notable for the following components:   Lactic Acid, Venous 3.9 (*)    All other components within normal limits  URINALYSIS, COMPLETE (UACMP) WITH MICROSCOPIC - Abnormal; Notable for the following components:   Color, Urine YELLOW (*)    APPearance CLEAR (*)    Bacteria, UA RARE (*)    All other components within normal limits  SARS CORONAVIRUS 2 (HOSPITAL ORDER, PERFORMED IN [facility])  C DIFFICILE QUICK SCREEN W PCR REFLEX  GASTROINTESTINAL PANEL BY PCR, STOOL (REPLACES STOOL CULTURE)  TROPONIN I  LACTIC ACID, PLASMA  TROPONIN I   ____________________________________________  EKG  EKG  read interpreted by me shows sinus bradycardia rate of 55 normal axis he has ST segment elevation in V3 and slight in V4 but looks more like early repolarization anything else.  Computer is reading acute pericarditis.  Patient again has no chest pain or tightness or other discomfort. ____________________________________________  RADIOLOGY  ED MD interpretation:    Official radiology report(s): Dg Chest Portable 1 View  Result Date: 03/10/2019 CLINICAL DATA:  Generalized weakness. EXAM: PORTABLE CHEST 1 VIEW COMPARISON:  06/06/2014. FINDINGS: Mediastinum hilar structures normal. Lungs are clear. Calcified pulmonary nodules consistent with granulomas. Heart size normal. No pleural effusion or pneumothorax. Degenerative change thoracic spine. IMPRESSION: No acute cardiopulmonary disease. Electronically Signed  ByMaisie Fus  Register   On: 03/10/2019 12:42    ____________________________________________   PROCEDURES  Procedure(s) performed (including Critical Care):  Procedures   ____________________________________________   INITIAL IMPRESSION / ASSESSMENT AND PLAN / ED COURSE  Kathaleen Grinder was evaluated in Emergency Department on 03/10/2019 for the symptoms described in the history of present illness. He was evaluated in the context of the global COVID-19 pandemic, which necessitated consideration that the patient might be at risk for infection with the SARS-CoV-2 virus that causes COVID-19. Institutional protocols and algorithms that pertain to the evaluation of patients at risk for COVID-19 are in a state of rapid change based on information released by regulatory bodies including the CDC and federal and state organizations. These policies and algorithms were followed during the patient's care in the ED.      After 2 L patient remains with low blood pressure.  There is no apparent cause for this no source for infection that we can find.  He has not provided a stool specimen for  [place].  We will get him in the hospital continue fluids and evaluation.  We will also get a second troponin.       ____________________________________________   FINAL CLINICAL IMPRESSION(S) / ED DIAGNOSES  Final diagnoses:  Hypotension, unspecified hypotension type  Elevated lactic acid level     ED Discharge Orders    None       Note:  This document was prepared using Dragon voice recognition software and may include unintentional dictation errors.    Arnaldo Natal, MD 03/10/19 [id number]

## 2019-03-10 NOTE — Significant Event (Signed)
[facility] STEMI Note  Date: 03/10/19 Time: 11:48 AM  Code STEMI called by EMS.  Patient reported weakness and fatigue this morning after taking AM medications.  EMS found him to be hypotensive with SBP in the 70's.  EKG concerning for possible anterior ST elevation.  Currently, the patient is asymptomatic, denying chest pain and shortness of breath.  EMS EKGs personally reviewed and are not diagnostic for STEMI.  EKG here shows sinus bradycardia with J point elevation in V3 and V4.  Findings are not definitive for STEMI.  I have recommended against emergent cardiac catheterization and further w/u of weakness and hypotension by the ED staff.  Yvonne Kendall, MD [facility] Pager: [phone]

## 2019-03-10 NOTE — ED Notes (Signed)
ED TO INPATIENT HANDOFF REPORT  ED Nurse Name and Phone #: Erskine Squibb [phone]  S Name/Age/Gender David Sherman 53 y.o. male Room/Bed: ED07A/ED07A  Code Status   Code Status: Not on file  Home/SNF/Other Home Patient oriented to: self, place, time and situation Is this baseline? Yes   Triage Complete: Triage complete  Chief Complaint STEMI  Triage Note EMS called to residence for c/o weakness.l  EMS reports patient taking home medication this morning and felt weak and ill after taking.  Family called EMS at around 1030.  On EMS arrival SBP:  70's.  400 cc NS given.     Allergies No Known Allergies  Level of Care/Admitting Diagnosis ED Disposition    ED Disposition Condition Comment   Admit  The patient appears reasonably stabilized for admission considering the current resources, flow, and capabilities available in the ED at this time, and I doubt any other [facility] requiring further screening and/or treatment in the ED prior to admission is  present.       B Medical/Surgery History Past Medical History:  Diagnosis Date  . Chronic back pain   . COPD (chronic obstructive pulmonary disease) (HCC)   . Hernia of abdominal wall   . Hypertension    Past Surgical History:  Procedure Laterality Date  . HERNIA REPAIR       A IV Location/Drains/Wounds Patient Lines/Drains/Airways Status   Active Line/Drains/Airways    Name:   Placement date:   Placement time:   Site:   Days:   Peripheral IV 03/10/19 Left Arm   03/10/19    1145    Arm   less than 1   Peripheral IV 03/10/19 Right Arm   03/10/19    1145    Arm   less than 1          Intake/Output Last 24 hours  Intake/Output Summary (Last 24 hours) at 03/10/2019 1503 Last data filed at 03/10/2019 1453 Gross per 24 hour  Intake 3000 ml  Output 600 ml  Net 2400 ml    Labs/Imaging Results for orders placed or performed during the hospital encounter of 03/10/19 (from the past 48 hour(s))  Comprehensive metabolic panel      Status: Abnormal   Collection Time: 03/10/19 11:44 AM  Result Value Ref Range   Sodium 134 (L) 135 - 145 mmol/L   Potassium 3.8 3.5 - 5.1 mmol/L   Chloride 101 98 - 111 mmol/L   CO2 23 22 - 32 mmol/L   Glucose, Bld 74 70 - 99 mg/dL   BUN 7 6 - 20 mg/dL   Creatinine, Ser 3.38 0.61 - 1.24 mg/dL   Calcium 8.0 (L) 8.9 - 10.3 mg/dL   Total Protein 5.3 (L) 6.5 - 8.1 g/dL   Albumin 2.8 (L) 3.5 - 5.0 g/dL   AST 59 (H) 15 - 41 U/L   ALT 32 0 - 44 U/L   Alkaline Phosphatase 81 38 - 126 U/L   Total Bilirubin 0.7 0.3 - 1.2 mg/dL   GFR calc non Af Amer >60 >60 mL/min   GFR calc Af Amer >60 >60 mL/min   Anion gap 10 5 - 15    Comment: Performed at [facility], [address] [place], [place] [address]  Troponin I - Once     Status: None   Collection Time: 03/10/19 11:44 AM  Result Value Ref Range   Troponin I <0.03 <0.03 ng/mL    Comment: Performed at [facility], [address] [place], [place] [address]  CBC with Differential     Status: Abnormal   Collection Time: 03/10/19 11:44 AM  Result Value Ref Range   WBC 5.0 4.0 - 10.5 K/uL   RBC 3.47 (L) 4.22 - 5.81 MIL/uL   Hemoglobin 12.6 (L) 13.0 - 17.0 g/dL   HCT 19.1 (L) 47.8 - 29.5 %   MCV 100.0 80.0 - 100.0 fL   MCH 36.3 (H) 26.0 - 34.0 pg   MCHC 36.3 (H) 30.0 - 36.0 g/dL   RDW 62.1 30.8 - 65.7 %   Platelets 173 150 - 400 K/uL   nRBC 0.0 0.0 - 0.2 %   Neutrophils Relative % 35 %   Neutro Abs 1.7 1.7 - 7.7 K/uL   Lymphocytes Relative 45 %   Lymphs Abs 2.2 0.7 - 4.0 K/uL   Monocytes Relative 15 %   Monocytes Absolute 0.8 0.1 - 1.0 K/uL   Eosinophils Relative 4 %   Eosinophils Absolute 0.2 0.0 - 0.5 K/uL   Basophils Relative 1 %   Basophils Absolute 0.0 0.0 - 0.1 K/uL   Immature Granulocytes 0 %   Abs Immature Granulocytes 0.02 0.00 - 0.07 K/uL    Comment: Performed at [facility], [address] [place], [place] [address]  Lactic acid, plasma     Status: Abnormal   Collection Time:  03/10/19 11:44 AM  Result Value Ref Range   Lactic Acid, Venous 3.9 (HH) 0.5 - 1.9 mmol/L    Comment: CRITICAL RESULT CALLED TO, READ BACK BY AND VERIFIED WITH Tyriek Hofman AT 1216 ON 03/10/19 KLM Performed at [facility], [address] [place], [place] [address]   Urinalysis, Complete w Microscopic     Status: Abnormal   Collection Time: 03/10/19 12:04 PM  Result Value Ref Range   Color, Urine YELLOW (A) YELLOW   APPearance CLEAR (A) CLEAR   Specific Gravity, Urine 1.006 1.005 - 1.030   pH 6.0 5.0 - 8.0   Glucose, UA NEGATIVE NEGATIVE mg/dL   Hgb urine dipstick NEGATIVE NEGATIVE   Bilirubin Urine NEGATIVE NEGATIVE   Ketones, ur NEGATIVE NEGATIVE mg/dL   Protein, ur NEGATIVE NEGATIVE mg/dL   Nitrite NEGATIVE NEGATIVE   Leukocytes,Ua NEGATIVE NEGATIVE   RBC / HPF 0-5 0 - 5 RBC/hpf   WBC, UA 0-5 0 - 5 WBC/hpf   Bacteria, UA RARE (A) NONE SEEN   Squamous Epithelial / LPF 0-5 0 - 5    Comment: Performed at [facility], [address] [place], [place] [address]  SARS Coronavirus 2 [facility] order, Performed in [facility] hospital lab)     Status: None   Collection Time: 03/10/19 12:04 PM  Result Value Ref Range   SARS Coronavirus 2 NEGATIVE NEGATIVE    Comment: (NOTE) If result is NEGATIVE SARS-CoV-2 target nucleic acids are NOT DETECTED. The SARS-CoV-2 RNA is generally detectable in upper and lower  respiratory specimens during the acute phase of infection. The lowest  concentration of SARS-CoV-2 viral copies this assay can detect is 250  copies / mL. A negative result does not preclude SARS-CoV-2 infection  and should not be used as the sole basis for treatment or other  patient management decisions.  A negative result may occur with  improper specimen collection / handling, submission of specimen other  than nasopharyngeal swab, presence of viral mutation(s) within the  areas targeted by this assay, and inadequate number of viral copies  (<250 copies /  mL). A negative  result must be combined with clinical  observations, patient history, and epidemiological information. If result is POSITIVE SARS-CoV-2 target nucleic acids are DETECTED. The SARS-CoV-2 RNA is generally detectable in upper and lower  respiratory specimens dur ing the acute phase of infection.  Positive  results are indicative of active infection with SARS-CoV-2.  Clinical  correlation with patient history and other diagnostic information is  necessary to determine patient infection status.  Positive results do  not rule out bacterial infection or co-infection with other viruses. If result is PRESUMPTIVE POSTIVE SARS-CoV-2 nucleic acids MAY BE PRESENT.   A presumptive positive result was obtained on the submitted specimen  and confirmed on repeat testing.  While 2019 novel coronavirus  (SARS-CoV-2) nucleic acids may be present in the submitted sample  additional confirmatory testing may be necessary for epidemiological  and / or clinical management purposes  to differentiate between  SARS-CoV-2 and other Sarbecovirus currently known to infect humans.  If clinically indicated additional testing with an alternate test  methodology [phone]) is advised. The SARS-CoV-2 RNA is generally  detectable in upper and lower respiratory sp ecimens during the acute  phase of infection. The expected result is Negative. Fact Sheet for Patients:  BoilerBrush.com.cy Fact Sheet for Healthcare Providers: https://pope.com/ This test is not yet approved or cleared by the [place] FDA and has been authorized for detection and/or diagnosis of SARS-CoV-2 by FDA under an Emergency Use Authorization (EUA).  This EUA will remain in effect (meaning this test can be used) for the duration of the COVID-19 declaration under Section 564(b)(1) of the Act, 21 U.S.C. section 360bbb-3(b)(1), unless the authorization is terminated or revoked  sooner. Performed at [facility], [address], [place], [place] [address]    Dg Chest Portable 1 View  Result Date: 03/10/2019 CLINICAL DATA:  Generalized weakness. EXAM: PORTABLE CHEST 1 VIEW COMPARISON:  06/06/2014. FINDINGS: Mediastinum hilar structures normal. Lungs are clear. Calcified pulmonary nodules consistent with granulomas. Heart size normal. No pleural effusion or pneumothorax. Degenerative change thoracic spine. IMPRESSION: No acute cardiopulmonary disease. Electronically Signed   By: Maisie Fus  Register   On: 03/10/2019 12:42    Pending Labs Unresulted Labs (From admission, onward)    Start     Ordered   03/10/19 1445  Troponin I - ONCE - STAT  ONCE - STAT,   STAT     03/10/19 1444   03/10/19 1202  C difficile quick scan w PCR reflex  (C Difficile quick screen w PCR reflex panel)  Once, for 24 hours,   STAT     03/10/19 1201   03/10/19 1202  Gastrointestinal Panel by PCR , Stool  (Gastrointestinal Panel by PCR, Stool)  ONCE - STAT,   STAT     03/10/19 1201   03/10/19 1146  Lactic acid, plasma  Now then every 2 hours,   STAT     03/10/19 1145          Vitals/Pain Today's Vitals   03/10/19 1339 03/10/19 1340 03/10/19 1407 03/10/19 1437  BP:  (!) 81/50 90/60 110/81  Pulse:  (!) 58 (!) 55 (!) 57  Resp:   16   Temp:      TempSrc:      SpO2:  98% 98% 98%  Weight:      PainSc: 0-No pain  0-No pain 0-No pain    Isolation Precautions Enteric precautions (UV disinfection)  Medications Medications  sodium chloride 0.9 % bolus 1,000 mL (0 mLs Intravenous Stopped 03/10/19  1230)  sodium chloride 0.9 % bolus 1,000 mL (0 mLs Intravenous Stopped 03/10/19 1338)  sodium chloride 0.9 % bolus 1,000 mL (0 mLs Intravenous Stopped 03/10/19 1453)    Mobility walks Low fall risk   Focused Assessments .   R Recommendations: See Admitting Provider Note  Report given to:   Additional Notes:

## 2019-03-11 ENCOUNTER — Observation Stay
Admit: 2019-03-11 | Discharge: 2019-03-11 | Disposition: A | Discharge status: Home / Self Care | Payer: Medicaid Other | Attending: Internal Medicine | Admitting: Internal Medicine

## 2019-03-11 LAB — CBC
HCT: 37.5 % — ABNORMAL LOW (ref 39.0–52.0)
Hemoglobin: 13.1 g/dL (ref 13.0–17.0)
MCH: 36 pg — ABNORMAL HIGH (ref 26.0–34.0)
MCHC: 34.9 g/dL (ref 30.0–36.0)
MCV: 103 fL — ABNORMAL HIGH (ref 80.0–100.0)
Platelets: 194 10*3/uL (ref 150–400)
RBC: 3.64 MIL/uL — ABNORMAL LOW (ref 4.22–5.81)
RDW: 12.3 % (ref 11.5–15.5)
WBC: 11.4 10*3/uL — ABNORMAL HIGH (ref 4.0–10.5)
nRBC: 0 % (ref 0.0–0.2)

## 2019-03-11 LAB — BASIC METABOLIC PANEL
Anion gap: 8 (ref 5–15)
BUN: 6 mg/dL (ref 6–20)
CO2: 21 mmol/L — ABNORMAL LOW (ref 22–32)
Calcium: 7.7 mg/dL — ABNORMAL LOW (ref 8.9–10.3)
Chloride: 111 mmol/L (ref 98–111)
Creatinine, Ser: 0.73 mg/dL (ref 0.61–1.24)
GFR calc Af Amer: >60 mL/min (ref >60)
GFR calc non Af Amer: >60 mL/min (ref >60)
Glucose, Bld: 82 mg/dL (ref 70–99)
Potassium: 3.8 mmol/L (ref 3.5–5.1)
Sodium: 140 mmol/L (ref 135–145)

## 2019-03-11 LAB — HIV ANTIBODY (ROUTINE TESTING W REFLEX): HIV Screen 4th Generation wRfx: NONREACTIVE

## 2019-03-11 LAB — TSH: TSH: 0.506 u[IU]/mL (ref 0.350–4.500)

## 2019-03-11 NOTE — Progress Notes (Signed)
[employer] -  at [facility]   PATIENT NAME: David Sherman    MR#:  [id number]  DATE OF BIRTH:  [birth year]  SUBJECTIVE:  CHIEF COMPLAINT:   Chief Complaint  Patient presents with  . Weakness  . Hypotension   - feels better today, had a low grade fever - BP improved  REVIEW OF SYSTEMS:  Review of Systems  Constitutional: Positive for fever. Negative for chills and malaise/fatigue.  HENT: Negative for congestion, ear discharge, hearing loss and nosebleeds.   Eyes: Negative for blurred vision and double vision.  Respiratory: Negative for cough, shortness of breath and wheezing.   Cardiovascular: Negative for chest pain, palpitations and leg swelling.  Gastrointestinal: Negative for abdominal pain, constipation, diarrhea, nausea and vomiting.  Genitourinary: Negative for dysuria.  Musculoskeletal: Negative for myalgias.  Neurological: Positive for dizziness and tremors. Negative for seizures and headaches.  Psychiatric/Behavioral: Negative for depression.    DRUG ALLERGIES:  No Known Allergies  VITALS:  Blood pressure 139/83, pulse (!) 119, temperature 100 F (37.8 C), temperature source Oral, resp. rate 19, height 6\' 1"  (1.854 m), weight 60.9 kg, SpO2 100 %.  PHYSICAL EXAMINATION:  Physical Exam   GENERAL:  53 y.o.-year-old patient lying in the bed with no acute distress.  EYES: Pupils equal, round, reactive to light and accommodation. No scleral icterus. Extraocular muscles intact.  HEENT: Head atraumatic, normocephalic. Oropharynx and nasopharynx clear.  NECK:  Supple, no jugular venous distention. No thyroid enlargement, no tenderness.  LUNGS: Normal breath sounds bilaterally, no wheezing, rales,rhonchi or crepitation. No use of accessory muscles of respiration.  CARDIOVASCULAR: S1, S2 normal. No murmurs, rubs, or gallops.  ABDOMEN: Soft, nontender, nondistended. Bowel sounds present. No organomegaly or mass.  EXTREMITIES: No pedal edema,  cyanosis, or clubbing.  Resting tremors of both hands noted NEUROLOGIC: Cranial nerves II through XII are intact. Muscle strength 5/5 in all extremities. Sensation intact. Gait not checked.  PSYCHIATRIC: The patient is alert and oriented x 3.  SKIN: No obvious rash, lesion, or ulcer.    LABORATORY PANEL:   CBC Recent Labs  Lab 03/11/19 0528  WBC 11.4*  HGB 13.1  HCT 37.5*  PLT 194   ------------------------------------------------------------------------------------------------------------------  Chemistries  Recent Labs  Lab 03/10/19 1144 03/11/19 0528  NA 134* 140  K 3.8 3.8  CL 101 111  CO2 23 21*  GLUCOSE 74 82  BUN 7 6  CREATININE 0.97 0.73  CALCIUM 8.0* 7.7*  AST 59*  --   ALT 32  --   ALKPHOS 81  --   BILITOT 0.7  --    ------------------------------------------------------------------------------------------------------------------  Cardiac Enzymes Recent Labs  Lab 03/10/19 1458  TROPONINI <0.03   ------------------------------------------------------------------------------------------------------------------  RADIOLOGY:  Dg Chest Portable 1 View  Result Date: 03/10/2019 CLINICAL DATA:  Generalized weakness. EXAM: PORTABLE CHEST 1 VIEW COMPARISON:  06/06/2014. FINDINGS: Mediastinum hilar structures normal. Lungs are clear. Calcified pulmonary nodules consistent with granulomas. Heart size normal. No pleural effusion or pneumothorax. Degenerative change thoracic spine. IMPRESSION: No acute cardiopulmonary disease. Electronically Signed   By: Maisie Fus  Register   On: 03/10/2019 12:42    EKG:   Orders placed or performed during the hospital encounter of 03/10/19  . EKG 12-Lead  . EKG 12-Lead  . ED EKG  . ED EKG    ASSESSMENT AND PLAN:   53 year old man with  past medical history of chronic back pain and COPD not on home oxygen, hypertension presents to hospital secondary to weakness and dizziness.  1.  Hypotension-due to GI losses likely.  No  further diarrhea noted.  IV fluids given and blood pressure is much improved.  Continue to hold antihypertensives -Orthostatic blood pressure changes improved -Low-grade fever but no source of sepsis identified. -Chest x-ray negative, UA negative.  Blood cultures ordered today.  No further GI symptoms.  Monitor closely with fluids for 1 more day  2.  GERD-Protonix  3.  COPD-stable.  Continue home inhalers  4.  DVT prophylaxis-Lovenox  Physical therapy recommended home health   All the records are reviewed and case discussed with Care Management/Social Workerr. Management plans discussed with the patient, family and they are in agreement.  CODE STATUS: Full code  TOTAL TIME TAKING CARE OF THIS PATIENT: 38 minutes.   POSSIBLE D/C IN 1-2 DAYS, DEPENDING ON CLINICAL CONDITION.   Enid Baasadhika Loana Salvaggio M.D on 03/11/2019 at 12:04 PM  Between 7am to 6pm - Pager - [phone]  After 6pm go to [email or web address] - Social research officer, governmentpassword [email or web address]  [facility]  Office  [phone]  CC: Primary care physician; Orene DesanctisBehling, Karen, MD

## 2019-03-11 NOTE — Evaluation (Signed)
Physical Therapy Evaluation Patient Details Name: David GrinderCharles L Sherman MRN: [id number] DOB: 1965-11-03 Today's Date: 03/11/2019   History of Present Illness  David Sherman Sherman is a [age]o male who comes to [facility] on 5/30 from home after being unable to stand or AMB near his baseline level d/t dizziness and acute onset weakness in legs. Pt has had similar episodes in the last 12 months (reported 4), one associated with a fall and mandibular fracture just a few weeks prior. Pt reports recent diarrhea and poor PO intake tolerance 2/2 jaw pain. Pt arrives with low BP and noted to be dehydrated.   Clinical Impression  Pt admitted with above diagnosis. Pt currently with functional limitations due to the deficits listed below (see "PT Problem List"). Upon entry, pt in bed, awake and agreeable to participate. The pt is alert and oriented x3, pleasant, conversational, and generally a good historian. Pt reports acute on chronic pain from being in bed too long, improved after AMB in room. RN reports orthos negative today. Pt denies any acute impairment this date, his giddiness and presyncope prodrome are since resolved. HR remains elevated with household AMB, 119BPM. Mild gait instability as per baseline, but pt is steady with IV pole and shows good safety awareness and awareness of deficits. Functional mobility assessment demonstrates increased time requirements, fair tolerance, but no frank need for physical assistance, whereas the patient performed these at a similar level of independence PTA. Pt has chronic baseline activity intolerance, sedency, pain, and gait instability and would benefit from HHPT services in the home to improve his capacity to perform IADL and safety with ADL. Pt will benefit from skilled PT intervention to increase independence and safety with basic mobility in preparation for discharge to the venue listed below.       Follow Up Recommendations Home health PT    Equipment Recommendations   None recommended by PT    Recommendations for Other Services       Precautions / Restrictions Precautions Precautions: Fall      Mobility  Bed Mobility Overal bed mobility: Independent                Transfers Overall transfer level: Independent Equipment used: None             General transfer comment: STSx1, SPTx1   Ambulation/Gait Ambulation/Gait assistance: Min guard Gait Distance (Feet): 150 Feet Assistive device: IV Pole       General Gait Details: mild gait instability which is rectified with use of IV pole. No giddiness. Pt reports AMB is per baseline at this time.   Stairs            Wheelchair Mobility    Modified Rankin (Stroke Patients Only)       Balance Overall balance assessment: History of Falls;Modified Independent;Mild deficits observed, not formally tested                                           Pertinent Vitals/Pain Pain Assessment: Faces Faces Pain Scale: Hurts even more Pain Location: low back and upper thoracic back Pain Descriptors / Indicators: Aching;Radiating Pain Intervention(s): Limited activity within patient's tolerance;Monitored during session;Repositioned    Home Living Family/patient expects to be discharged to:: Private residence Living Arrangements: Other relatives(brother adn brother's fiancee) Available Help at Discharge: Family Type of Home: Mobile home Home Access: Stairs to enter Entrance Stairs-Rails: Left Entrance  Stairs-Number of Steps: 5 Home Layout: One level Home Equipment: Walker - 2 wheels;Cane - single point Additional Comments: has difficulty with weight bearing through arms d/t chronic spine issues.    Prior Function Level of Independence: Independent         Comments: largely sedentary for past 6 years d/t chronic back pain and SOB. mostly household distances, but 2-3x weekly will AMB to mailbox.      Hand Dominance   Dominant Hand: Left     Extremity/Trunk Assessment   Upper Extremity Assessment Upper Extremity Assessment: Overall WFL for tasks assessed    Lower Extremity Assessment Lower Extremity Assessment: Overall WFL for tasks assessed    Cervical / Trunk Assessment Cervical / Trunk Assessment: Normal  Communication   Communication: No difficulties  Cognition Arousal/Alertness: Awake/alert Behavior During Therapy: WFL for tasks assessed/performed Overall Cognitive Status: Within Functional Limits for tasks assessed                                        General Comments      Exercises     Assessment/Plan    PT Assessment Patient needs continued PT services  PT Problem List Decreased strength;Decreased activity tolerance;Decreased balance;Decreased mobility;Cardiopulmonary status limiting activity       PT Treatment Interventions Therapeutic activities;Functional mobility training;Balance training;Therapeutic exercise    PT Goals (Current goals can be found in the Care Plan section)  Acute Rehab PT Goals Patient Stated Goal: improve his balance for safer ADL completion PT Goal Formulation: With patient Time For Goal Achievement: 03/25/19 Potential to Achieve Goals: Fair    Frequency Min 2X/week   Barriers to discharge        Co-evaluation               AM-PAC PT "6 Clicks" Mobility  Outcome Measure Help needed turning from your back to your side while in a flat bed without using bedrails?: None Help needed moving from lying on your back to sitting on the side of a flat bed without using bedrails?: None Help needed moving to and from a bed to a chair (including a wheelchair)?: None Help needed standing up from a chair using your arms (e.g., wheelchair or bedside chair)?: None Help needed to walk in hospital room?: A Little Help needed climbing 3-5 steps with a railing? : A Little 6 Click Score: 22    End of Session   Activity Tolerance: Patient tolerated treatment  well;Patient limited by fatigue Patient left: in chair;with call bell/phone within reach;Other (comment)(attending MD in room) Nurse Communication: Other (comment);Mobility status(BM) PT Visit Diagnosis: Unsteadiness on feet (R26.81);Other abnormalities of gait and mobility (R26.89);Muscle weakness (generalized) (M62.81);Repeated falls (R29.6)    Time: 8016-5537 PT Time Calculation (min) (ACUTE ONLY): 28 min   Charges:   PT Evaluation $PT Eval Low Complexity: 1 Low PT Treatments $Therapeutic Exercise: 8-22 mins        9:43 AM, 03/11/19 Rosamaria Lints, PT, DPT Physical Therapist - [facility]  [phone] (ASCOM)   Victorya Hillman C 03/11/2019, 9:39 AM

## 2019-03-12 LAB — ECHOCARDIOGRAM COMPLETE
Height: 73 in
Weight: 2148.8 oz

## 2019-03-12 LAB — BASIC METABOLIC PANEL
Anion gap: 8 (ref 5–15)
BUN: 6 mg/dL (ref 6–20)
CO2: 23 mmol/L (ref 22–32)
Calcium: 7.6 mg/dL — ABNORMAL LOW (ref 8.9–10.3)
Chloride: 107 mmol/L (ref 98–111)
Creatinine, Ser: 0.71 mg/dL (ref 0.61–1.24)
GFR calc Af Amer: >60 mL/min (ref >60)
GFR calc non Af Amer: >60 mL/min (ref >60)
Glucose, Bld: 105 mg/dL — ABNORMAL HIGH (ref 70–99)
Potassium: 3.1 mmol/L — ABNORMAL LOW (ref 3.5–5.1)
Sodium: 138 mmol/L (ref 135–145)

## 2019-03-12 LAB — CBC
HCT: 34.8 % — ABNORMAL LOW (ref 39.0–52.0)
Hemoglobin: 12.5 g/dL — ABNORMAL LOW (ref 13.0–17.0)
MCH: 36.4 pg — ABNORMAL HIGH (ref 26.0–34.0)
MCHC: 35.9 g/dL (ref 30.0–36.0)
MCV: 101.5 fL — ABNORMAL HIGH (ref 80.0–100.0)
Platelets: 151 10*3/uL (ref 150–400)
RBC: 3.43 MIL/uL — ABNORMAL LOW (ref 4.22–5.81)
RDW: 12.3 % (ref 11.5–15.5)
WBC: 13.4 10*3/uL — ABNORMAL HIGH (ref 4.0–10.5)
nRBC: 0 % (ref 0.0–0.2)

## 2019-03-12 MED ORDER — MECLIZINE HCL 12.5 MG PO TABS: 12.5000 mg | ORAL_TABLET | Freq: Three times a day (TID) | ORAL | 0 refills | Status: DC | PRN

## 2019-03-12 MED ORDER — POTASSIUM CHLORIDE CRYS ER 20 MEQ PO TBCR
40.0000 meq | EXTENDED_RELEASE_TABLET | ORAL | Status: AC
  Administered 2019-03-12 (×2): 40 meq via ORAL
  Filled 2019-03-12 (×2): qty 2

## 2019-03-12 MED ORDER — LEVOFLOXACIN 500 MG PO TABS
500.0000 mg | ORAL_TABLET | Freq: Every day | ORAL | Status: DC
  Administered 2019-03-12: 500 mg via ORAL
  Filled 2019-03-12: qty 1

## 2019-03-12 MED ORDER — LEVOFLOXACIN 500 MG PO TABS: 500.0000 mg | ORAL_TABLET | Freq: Every day | ORAL | 0 refills | Status: AC

## 2019-03-12 NOTE — Plan of Care (Signed)
Low grade fever noted.  Pasty brown stool X 1 this shift.  No voiced complaints this shift.

## 2019-03-12 NOTE — Progress Notes (Signed)
Kathaleen Grinder to be D/C'd Home per MD order.  Discussed prescriptions and follow up appointments with the patient. Prescriptions given to patient, medication list explained in detail. Pt verbalized understanding.  Allergies as of 03/12/2019   No Known Allergies     Medication List    STOP taking these medications   propranolol 20 MG tablet Commonly known as:  INDERAL     TAKE these medications   albuterol 108 (90 Base) MCG/ACT inhaler Commonly known as:  VENTOLIN HFA Inhale 2 puffs into the lungs every 4 (four) hours as needed for wheezing.   ascorbic acid 500 MG tablet Commonly known as:  VITAMIN C Take 1,000 mg by mouth daily.   budesonide-formoterol 160-4.5 MCG/ACT inhaler Commonly known as:  SYMBICORT Inhale 2 puffs into the lungs 2 (two) times a day.   citalopram 20 MG tablet Commonly known as:  CELEXA Take 20 mg by mouth daily.   levofloxacin 500 MG tablet Commonly known as:  LEVAQUIN Take 1 tablet (500 mg total) by mouth daily for 6 days. Start taking on:  March 13, 2019   lisinopril 10 MG tablet Commonly known as:  ZESTRIL Take 10 mg by mouth daily.   meclizine 12.5 MG tablet Commonly known as:  ANTIVERT Take 1 tablet (12.5 mg total) by mouth 3 (three) times daily as needed for dizziness.   omeprazole 40 MG capsule Commonly known as:  PRILOSEC Take 40 mg by mouth 2 (two) times a day.   rOPINIRole 0.5 MG tablet Commonly known as:  REQUIP Take 0.5 mg by mouth 3 (three) times daily.   tiZANidine 4 MG tablet Commonly known as:  ZANAFLEX Take 2-4 mg by mouth 3 (three) times daily as needed for muscle spasms.       Vitals:   03/12/19 0441 03/12/19 0738  BP: (!) 158/87 (!) 148/74  Pulse: 66 63  Resp: 18 19  Temp: 99.9 F (37.7 C) 99 F (37.2 C)  SpO2: 100% 100%    Skin clean, dry and intact without evidence of skin break down, no evidence of skin tears noted. IV catheter discontinued intact. Site without signs and symptoms of complications.  Dressing and pressure applied. Pt denies pain at this time. No complaints noted.  An After Visit Summary was printed and given to the patient. Waiting on ride to be discharged home Karmah Potocki M Salil Raineri

## 2019-03-12 NOTE — Discharge Summary (Signed)
[facility] [place] [facility]   PATIENT NAME: David Sherman    MR#:  [id number]  DATE OF BIRTH:  15-Sep-1966  DATE OF ADMISSION:  03/10/2019   ADMITTING PHYSICIAN: Campbell Stall, MD  DATE OF DISCHARGE: 03/12/2019  2:43 PM  PRIMARY CARE PHYSICIAN: Orene Desanctis, MD   ADMISSION DIAGNOSIS:   Elevated lactic acid level [R79.89] Hypotension, unspecified hypotension type [I95.9]  DISCHARGE DIAGNOSIS:   Active Problems:   Hypotension   SECONDARY DIAGNOSIS:   Past Medical History:  Diagnosis Date  . Chronic back pain   . COPD (chronic obstructive pulmonary disease) (HCC)   . Hernia of abdominal wall   . Hypertension     HOSPITAL COURSE:   53 year old man with  past medical history of chronic back pain and COPD not on home oxygen, hypertension presents to hospital secondary to weakness and dizziness.  1.  Hypotension-and low-grade fevers-. -No gastroenteritis symptoms since admission to hospital the patient had diarrhea prior to admission.  Could be a viral infection.  COVID negative. Has some dry cough and bronchitis symptoms.  He is started on Levaquin and will be discharged on the same.  Chest x-ray is clear.  Urine analysis is negative.  Orthostatic blood pressure changes improved after IV fluids.  -Blood cultures were done and negative in the hospital.  2.  GERD-Protonix  3.  COPD-stable.  Continue home inhalers  4.  Hypokalemia- replaced prior to discharge  Patient will be discharged home today  DISCHARGE CONDITIONS:   Guarded  CONSULTS OBTAINED:   None  DRUG ALLERGIES:   No Known Allergies DISCHARGE MEDICATIONS:   Allergies as of 03/12/2019   No Known Allergies     Medication List    STOP taking these medications   propranolol 20 MG tablet Commonly known as:  INDERAL     TAKE these medications   albuterol 108 (90 Base) MCG/ACT inhaler Commonly known as:  VENTOLIN HFA Inhale 2 puffs into the lungs every 4  (four) hours as needed for wheezing.   ascorbic acid 500 MG tablet Commonly known as:  VITAMIN C Take 1,000 mg by mouth daily.   budesonide-formoterol 160-4.5 MCG/ACT inhaler Commonly known as:  SYMBICORT Inhale 2 puffs into the lungs 2 (two) times a day.   citalopram 20 MG tablet Commonly known as:  CELEXA Take 20 mg by mouth daily.   levofloxacin 500 MG tablet Commonly known as:  LEVAQUIN Take 1 tablet (500 mg total) by mouth daily for 6 days. Start taking on:  March 13, 2019   lisinopril 10 MG tablet Commonly known as:  ZESTRIL Take 10 mg by mouth daily.   meclizine 12.5 MG tablet Commonly known as:  ANTIVERT Take 1 tablet (12.5 mg total) by mouth 3 (three) times daily as needed for dizziness.   omeprazole 40 MG capsule Commonly known as:  PRILOSEC Take 40 mg by mouth 2 (two) times a day.   rOPINIRole 0.5 MG tablet Commonly known as:  REQUIP Take 0.5 mg by mouth 3 (three) times daily.   tiZANidine 4 MG tablet Commonly known as:  ZANAFLEX Take 2-4 mg by mouth 3 (three) times daily as needed for muscle spasms.        DISCHARGE INSTRUCTIONS:   1.  PCP follow-up in 1 to 2 weeks  DIET:   Regular diet  ACTIVITY:   Activity as tolerated  OXYGEN:   Home Oxygen: No.  Oxygen Delivery: room air  DISCHARGE LOCATION:   home  If you experience worsening of your admission symptoms, develop shortness of breath, life threatening emergency, suicidal or homicidal thoughts you must seek medical attention immediately by calling 911 or calling your MD immediately  if symptoms less severe.  You Must read complete instructions/literature along with all the possible adverse reactions/side effects for all the Medicines you take and that have been prescribed to you. Take any new Medicines after you have completely understood and accpet all the possible adverse reactions/side effects.   Please note  You were cared for by a hospitalist during your hospital stay. If you have  any questions about your discharge medications or the care you received while you were in the hospital after you are discharged, you can call the unit and asked to speak with the hospitalist on call if the hospitalist that took care of you is not available. Once you are discharged, your primary care physician will handle any further medical issues. Please note that NO REFILLS for any discharge medications will be authorized once you are discharged, as it is imperative that you return to your primary care physician (or establish a relationship with a primary care physician if you do not have one) for your aftercare needs so that they can reassess your need for medications and monitor your lab values.    On the day of Discharge:  VITAL SIGNS:   Blood pressure (!) 148/74, pulse 63, temperature 99 F (37.2 C), temperature source Oral, resp. rate 19, height  (1.854 m), weight 61.8 kg, SpO2 100 %.  PHYSICAL EXAMINATION:    GENERAL:  53 y.o.-year-old patient lying in the bed with no acute distress.  EYES: Pupils equal, round, reactive to light and accommodation. No scleral icterus. Extraocular muscles intact.  HEENT: Head atraumatic, normocephalic. Oropharynx and nasopharynx clear.  NECK:  Supple, no jugular venous distention. No thyroid enlargement, no tenderness.  LUNGS: Normal breath sounds bilaterally, no wheezing, rales,rhonchi or crepitation. No use of accessory muscles of respiration.  CARDIOVASCULAR: S1, S2 normal. No murmurs, rubs, or gallops.  ABDOMEN: Soft, non-tender, non-distended. Bowel sounds present. No organomegaly or mass.  EXTREMITIES: No pedal edema, cyanosis, or clubbing.  NEUROLOGIC: Cranial nerves II through XII are intact. Muscle strength 5/5 in all extremities. Sensation intact. Gait not checked.  PSYCHIATRIC: The patient is alert and oriented x 3.  SKIN: No obvious rash, lesion, or ulcer.   DATA REVIEW:   CBC Recent Labs  Lab 03/12/19 0805  WBC 13.4*  HGB 12.5*   HCT 34.8*  PLT 151    Chemistries  Recent Labs  Lab 03/10/19 1144  03/12/19 0501  NA 134*   < > 138  K 3.8   < > 3.1*  CL 101   < > 107  CO2 23   < > 23  GLUCOSE 74   < > 105*  BUN 7   < > 6  CREATININE 0.97   < > 0.71  CALCIUM 8.0*   < > 7.6*  AST 59*  --   --   ALT 32  --   --   ALKPHOS 81  --   --   BILITOT 0.7  --   --    < > = values in this interval not displayed.     Microbiology Results  Results for orders placed or performed during the hospital encounter of 03/10/19  SARS Coronavirus 2 [facility] order, Performed in [facility] hospital lab)     Status: None   Collection Time: 03/10/19  12:04 PM  Result Value Ref Range Status   SARS Coronavirus 2 NEGATIVE NEGATIVE Final    Comment: (NOTE) If result is NEGATIVE SARS-CoV-2 target nucleic acids are NOT DETECTED. The SARS-CoV-2 RNA is generally detectable in upper and lower  respiratory specimens during the acute phase of infection. The lowest  concentration of SARS-CoV-2 viral copies this assay can detect is 250  copies / mL. A negative result does not preclude SARS-CoV-2 infection  and should not be used as the sole basis for treatment or other  patient management decisions.  A negative result may occur with  improper specimen collection / handling, submission of specimen other  than nasopharyngeal swab, presence of viral mutation(s) within the  areas targeted by this assay, and inadequate number of viral copies  (<250 copies / mL). A negative result must be combined with clinical  observations, patient history, and epidemiological information. If result is POSITIVE SARS-CoV-2 target nucleic acids are DETECTED. The SARS-CoV-2 RNA is generally detectable in upper and lower  respiratory specimens dur ing the acute phase of infection.  Positive  results are indicative of active infection with SARS-CoV-2.  Clinical  correlation with patient history and other diagnostic information is  necessary to determine  patient infection status.  Positive results do  not rule out bacterial infection or co-infection with other viruses. If result is PRESUMPTIVE POSTIVE SARS-CoV-2 nucleic acids MAY BE PRESENT.   A presumptive positive result was obtained on the submitted specimen  and confirmed on repeat testing.  While 2019 novel coronavirus  (SARS-CoV-2) nucleic acids may be present in the submitted sample  additional confirmatory testing may be necessary for epidemiological  and / or clinical management purposes  to differentiate between  SARS-CoV-2 and other Sarbecovirus currently known to infect humans.  If clinically indicated additional testing with an alternate test  methodology [phone]([id number]) is advised. The SARS-CoV-2 RNA is generally  detectable in upper and lower respiratory sp ecimens during the acute  phase of infection. The expected result is Negative. Fact Sheet for Patients:  BoilerBrush.com.cyhttps://www.fda.gov/media/136312/download Fact Sheet for Healthcare Providers: https://pope.com/https://www.fda.gov/media/136313/download This test is not yet approved or cleared by the [place] FDA and has been authorized for detection and/or diagnosis of SARS-CoV-2 by FDA under an Emergency Use Authorization (EUA).  This EUA will remain in effect (meaning this test can be used) for the duration of the COVID-19 declaration under Section 564(b)(1) of the Act, 21 U.S.C. section 360bbb-3(b)(1), unless the authorization is terminated or revoked sooner. Performed at [facility], [address] [place], [place] [address]   CULTURE, BLOOD (ROUTINE X 2) w Reflex to ID Panel     Status: None (Preliminary result)   Collection Time: 03/11/19  9:49 AM  Result Value Ref Range Status   Specimen Description BLOOD LT HAND  Final   Special Requests   Final    BOTTLES DRAWN AEROBIC AND ANAEROBIC Blood Culture adequate volume   Culture   Final    NO GROWTH < 24 HOURS Performed at [facility], [address]  [place], [place] [address]    Report Status PENDING  Incomplete  CULTURE, BLOOD (ROUTINE X 2) w Reflex to ID Panel     Status: None (Preliminary result)   Collection Time: 03/11/19  9:49 AM  Result Value Ref Range Status   Specimen Description BLOOD LAC  Final   Special Requests   Final    BOTTLES DRAWN AEROBIC AND ANAEROBIC Blood Culture adequate volume   Culture  Final    NO GROWTH < 24 HOURS Performed at [facility], [address] [place], [place] [address]    Report Status PENDING  Incomplete    RADIOLOGY:  No results found.   Management plans discussed with the patient, family and they are in agreement.  CODE STATUS:     Code Status Orders  (From admission, onward)         Start     Ordered   03/10/19 1629  Full code  Continuous     03/10/19 1628        Code Status History    This patient has a current code status but no historical code status.      TOTAL TIME TAKING CARE OF THIS PATIENT: 38 minutes.    Enid Baas M.D on 03/12/2019 at 2:59 PM  Between 7am to 6pm - Pager - [phone]  After 6pm go to [email or web address] - Social research officer, government  [facility] [place] [facility]  Office  [phone]  CC: Primary care physician; Orene Desanctis, MD   Note: This dictation was prepared with Dragon dictation along with smaller phrase technology. Any transcriptional errors that result from this process are unintentional.

## 2019-03-16 LAB — CULTURE, BLOOD (ROUTINE X 2)
Culture: NO GROWTH
Culture: NO GROWTH
Special Requests: ADEQUATE
Special Requests: ADEQUATE

## 2020-05-20 ENCOUNTER — Ambulatory Visit: Payer: Medicaid Other | Attending: Critical Care Medicine

## 2020-05-20 DIAGNOSIS — Z23 Encounter for immunization: Secondary | ICD-10-CM

## 2020-05-20 NOTE — Progress Notes (Signed)
   Covid-19 Vaccination Clinic  Name:  David Sherman    MRN: [id number] DOB: 02/25/1966  05/20/2020  [name] was observed post Covid-19 immunization for 15 minutes without incident. He was provided with Vaccine Information Sheet and instruction to access the V-Safe system.   [name] was instructed to call 911 with any severe reactions post vaccine: Marland Kitchen Difficulty breathing  . Swelling of face and throat  . A fast heartbeat  . A bad rash all over body  . Dizziness and weakness   Immunizations Administered    Name Date Dose VIS Date Route   Pfizer COVID-19 Vaccine 05/20/2020  3:14 PM 0.3 mL 12/06/2018 Intramuscular   Manufacturer: [employer], [employer]   Lot: [id number]   NDC: [address]-8

## 2020-06-10 ENCOUNTER — Ambulatory Visit: Payer: Medicaid Other

## 2020-08-16 IMAGING — DX PORTABLE CHEST - 1 VIEW
2 series · 2 of 2 positions shown · non-contrast
Comparison: 06/06/2014.

CLINICAL DATA: Generalized weakness.

EXAM:
PORTABLE CHEST 1 VIEW

[chest ap (1 of 2)]
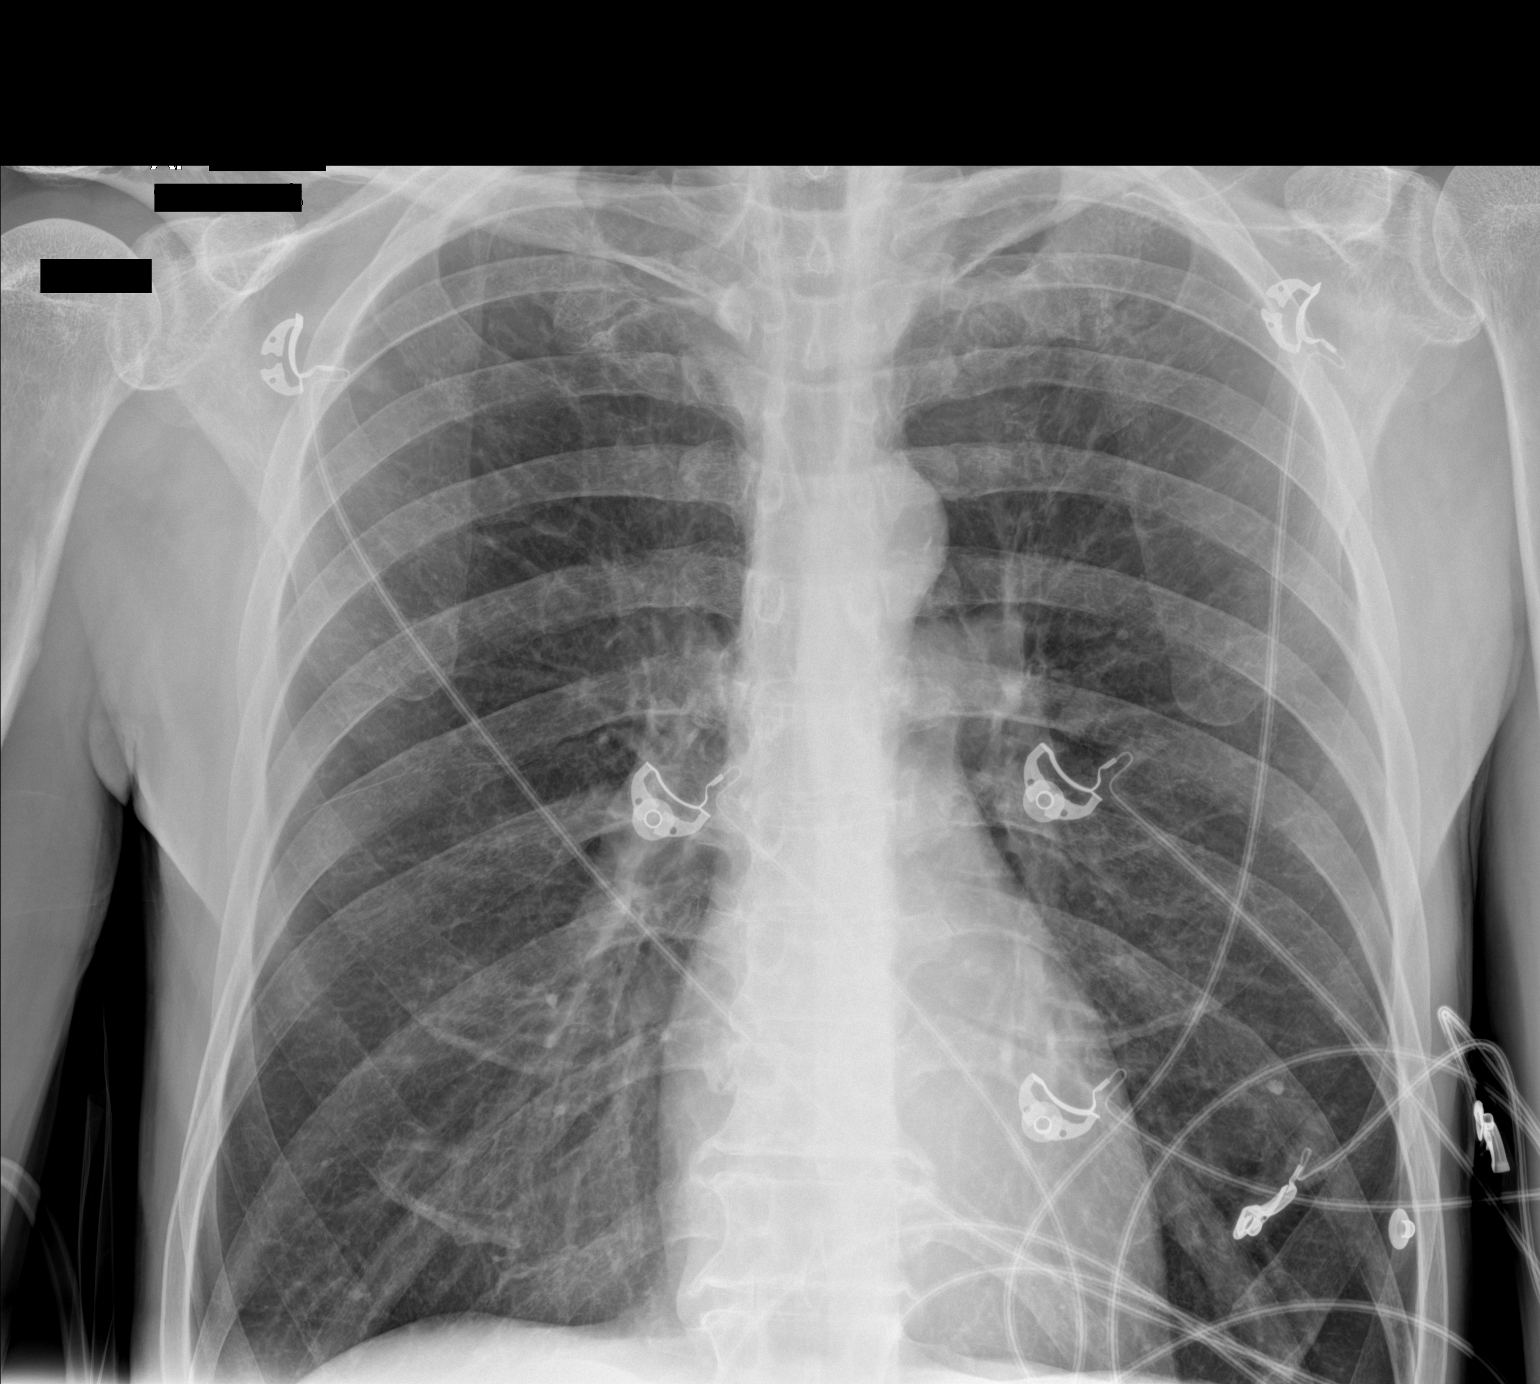

[chest ap (2 of 2)]
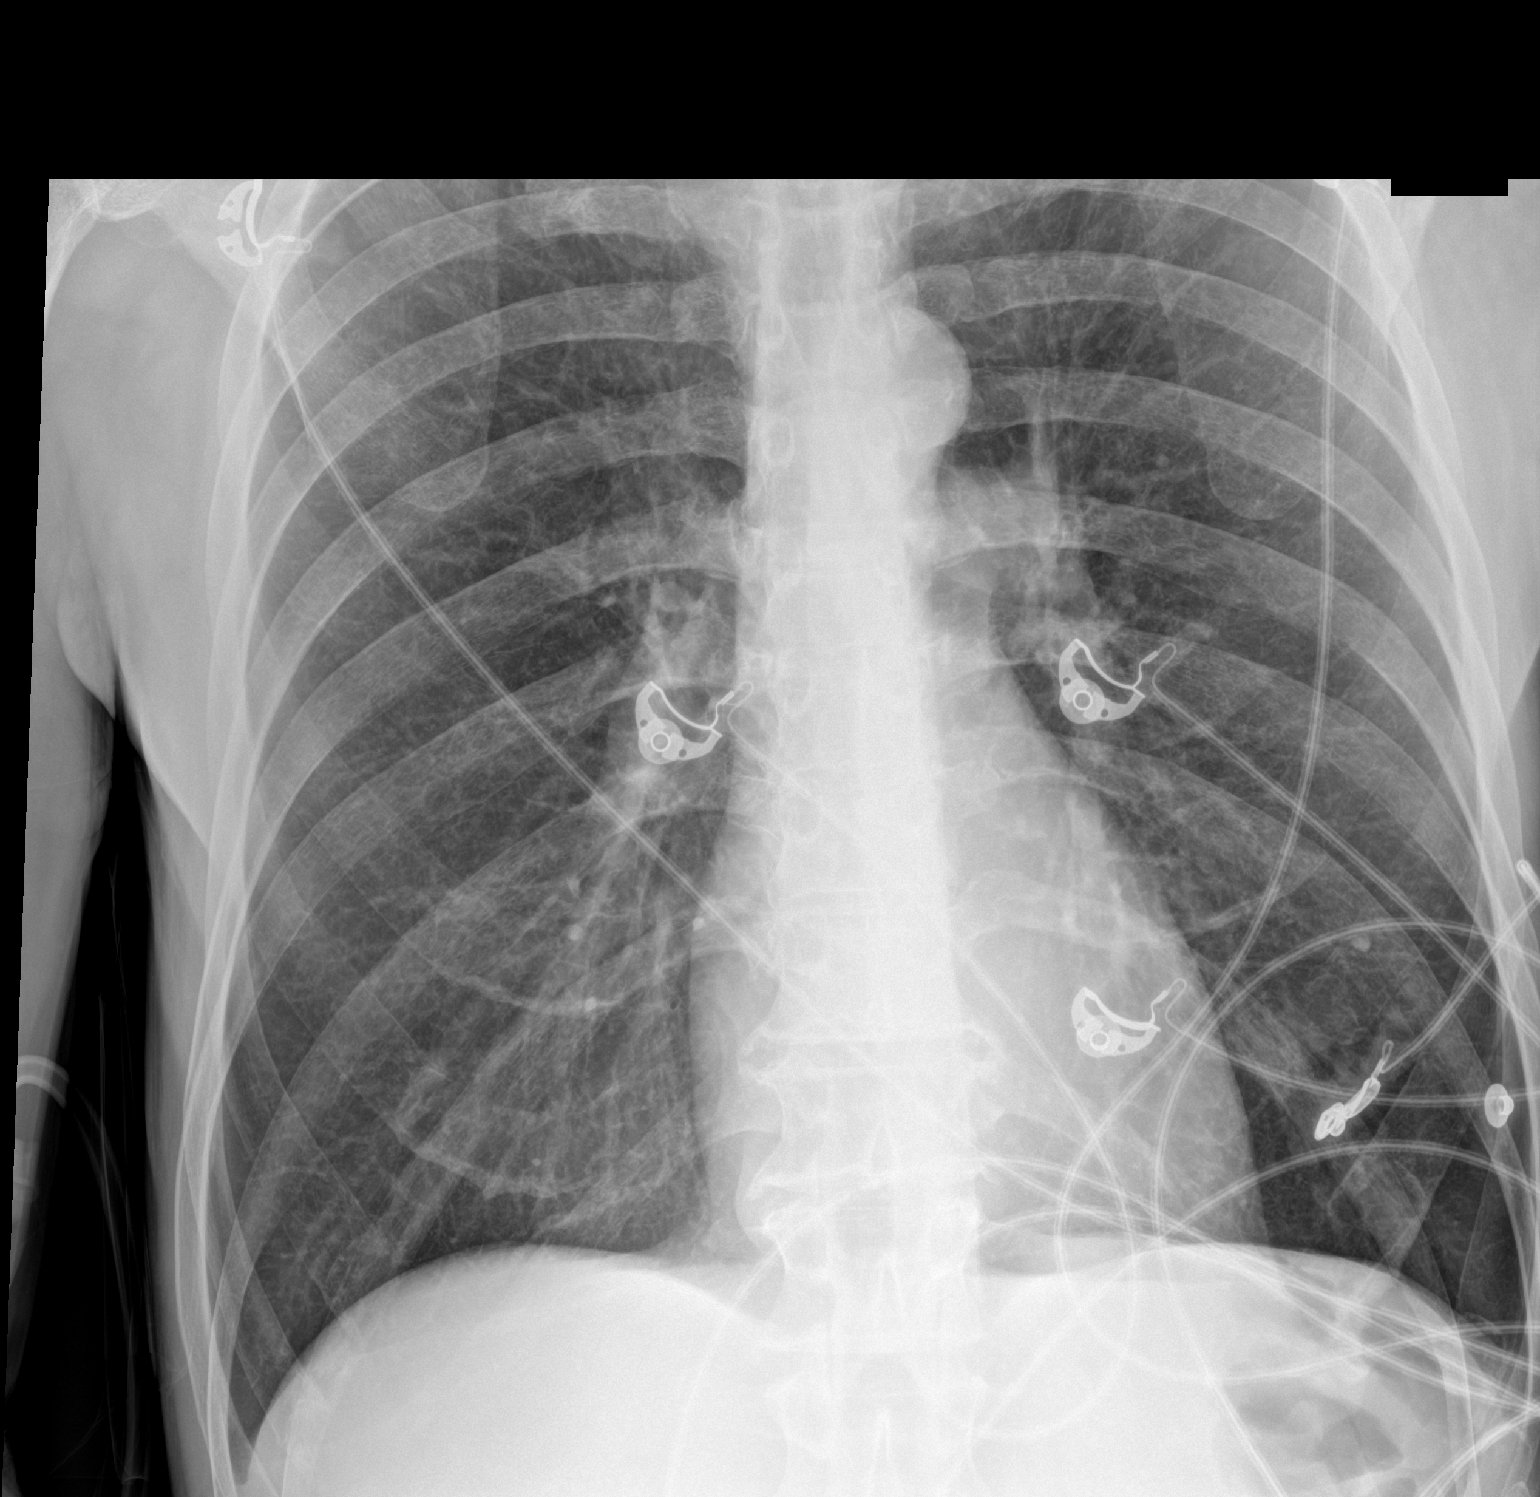

[2 of 2 positions shown; findings below may reference images not displayed]

FINDINGS: Mediastinum hilar structures normal. Lungs are clear. Calcified
pulmonary nodules consistent with granulomas. Heart size normal. No
pleural effusion or pneumothorax. Degenerative change thoracic
spine.
IMPRESSION: No acute cardiopulmonary disease.

## 2022-08-07 NOTE — Progress Notes (Signed)
 [facility] [employer] was not able to reach the patient by phone call.  The details of interventions are as follows:  Preventive Care-     08/07/2022    3:03 PM  Gap Closure  Age Group: Adult  Adult Yearly Physical Interventions Unable to reach - MyChart Unavailable  Comments: Can not accept calls at this time. No mychart.    COURTNEY SUGGS  For more information on [facility] services, click here.

## 2022-09-08 NOTE — Progress Notes (Signed)
 [facility] [employer] was not able to reach the patient by phone call.  The details of interventions are as follows:  Preventive Care-     09/08/2022   11:44 AM  Gap Closure  Age Group: Adult  Adult Yearly Physical Interventions Unable to reach - MyChart Unavailable  Comments: Can not accept calls at this time. Phone just rings no option to leave a voice message. No mychart.    COURTNEY SUGGS  For more information on [facility] services, click here.

## 2023-07-29 NOTE — Progress Notes (Signed)
 [facility] Chief Complaint:   Chief Complaint  Patient presents with  . general body pain    States has upper mid back pain, under left arm, extremities tingling and pain; pain greater than 10/10 unable to sleep well at night; states has not taken any medications x6 months.  . Hypertension   Subjective:   David Sherman is a 57 y.o. male in today for general body pain.  Not seen since 11/22.  At that time he complained of pain in right arm, upper mid back and low back and neck that is long standing.   Has severe pain in left shoulder.  Pain in hands.  Has cervical pain long standing.  Had previously seen pain management at [facility] and was not a candidate for chronic narcotics.  He was using naproxen at some point for chronic musculoskeletal pain but has not tried that.  No previous testing for inflammatory arthritis.  No family history of inflammatory arthritis.  Tried salon pas patches but cannot put them on.  Has not asked roommates for help.  Taking OTC pain meds of tylenol  and aleve. Drinking 2-3 drinks per day, vodka and OJ  History of falls with hx broken jaw. 2020 after similar fall. Cardiology w/u was negative.  Feet  Burning. Weakness in hands. Stopped all medications but still having falls. Has tremor for years and worse.  Not seen neurology in years. Drinking 3 mixed drinks per night.   Most recent vitamin B12 value Lab Results  Component Value Date   VITB12 180 08/12/2021     History of hypertension.  Last took lisinopril in 2021.  Checking his  blood pressure at home and has been high.  Chest pain across left chest.  He is not sure if this is related to his cervical spine pain.  Will get short of breath sometimes when this occurs but also anxious.  History of severe anxiety and depression and has not been taking medications for years. Lab Results  Component Value Date   CREATININE 0.9 08/12/2021     COPD.  SOB with exertion. Was Using symbicort bid and needing  albuterol  3 times per day.  Feeling more sob. Has been out of inhalers for past few days.   Lung nodule seen on CT scan.  Left lung noted 02/20/19..  06/04/2020 CT scan of his chest showed a stable indeterminate 1.8 cm left apical spiculated nodule. No new or enlarging pulmonary nodule.  They recommend  follow-up CT in 6 months for further assessment. Pt no showed appt with me for follow up.   Weight loss in past but has regained since last visit.  States appetite is strong but seems to gain and lose weight very quickly.  Denies polyuria or polydipsia but does note urgency to urination.  No family history diabetes Wt Readings from Last 10 Encounters:  07/29/23 59 kg (130 lb 1.1 oz)  08/12/21 61.2 kg (134 lb 14.7 oz)  08/01/20 59.8 kg (131 lb 12.8 oz)  04/30/20 56.5 kg (124 lb 9.6 oz)  03/26/20 54.3 kg (119 lb 12.8 oz)  07/07/19 58.5 kg (129 lb)  05/22/19 58.5 kg (129 lb)  03/20/19 58.9 kg (129 lb 13.6 oz)  03/03/19 59.9 kg (132 lb 0.9 oz)  02/20/19 63.5 kg (140 lb)   Lab Results  Component Value Date   HGBA1C 4.9 08/12/2021   HGBA1C 4.8 03/20/2019   Essential tremor for years.  Worse.  Previously took primidone.  Stopped using years ago when it  seemed to become ineffective.  Also used Inderal at some point but it has been many years since he tried. Hx depression and anxiety.  Previously took Cymbalta and Celexa .  Also took diazepam  which she was able to wean off of in 2021. PHQ 2/9 last 3 flowsheet values     07/03/2020    3:23 PM 07/29/2020    2:20 PM 07/29/2023   11:02 AM  PHQ-2/9 Depression Screening   Little interest or pleasure in doing things   1 *  Feeling down, depressed, or hopeless   1 *  Patient Health Questionnaire-2 Score   2 *  (OBSOLETE) Little interest or pleasure in doing things 1 2   (OBSOLETE) Feeling down, depressed, or hopeless (or irritable for Teens only)? 3 3   (OBSOLETE) Total Prescreening Score 4 5   (OBSOLETE) Trouble falling or staying asleep, or  sleeping too much? 3 3   (OBSOLETE) Feeling tired or having little energy? 0 3   (OBSOLETE) Poor appetite or overeating? 0 0   (OBSOLETE) Feeling bad about yourself - or that you are a failure or have let yourself or your family down? 1 0   (OBSOLETE) Trouble concentrating on things, such as reading the newspaper or watching television? 0 1   (OBSOLETE) Moving or speaking so slowly that other people could have noticed?  Or the opposite - being so fidgety or restless that you have been moving around a lot more than usual? 0 1   (OBSOLETE) Thoughts that you would be better off dead, or of hurting yourself in some way? 1 1   (OBSOLETE) How difficult have these problems made it for you do your work, take care of things at home, or get along with people? Not difficult at all Somewhat difficult   (OBSOLETE) Total Score = 9 14     * Patient-reported    GAD 7 result:     07/29/2023  GAD-7  Over the past 2 weeks, have you felt nervous, anxious, or on edge? More than half the days *  Over the past 2 weeks, have you not been able to stop or control worrying? Several days *  Worrying too much about different things Several days *  Trouble relaxing More than half the days *  Being so restless that it is hard to sit still Not at all *  Becoming easily annoyed or irritable More than half the days *  Feeling afraid as if something awful might happen Several days *  GAD-7 Total Score 9 *  --   How difficult have these problems made it for you to do your work, take care of things at home, or get along with other people? Somewhat difficult *    * Patient-reported     Patient Active Problem List  Diagnosis  . Abdominal pain, recurrent  . Gastroesophageal reflux disease with esophagitis  . COPD with emphysema (CMS/HHS-HCC)  . Chronic depression  . Chronic bilateral low back pain without sciatica  . Generalized anxiety disorder  . Cervicalgia  . Other cervical disc degeneration, unspecified  cervical region  . Essential tremor  . Tobacco abuse  . Essential hypertension  . Cervical cord myelomalacia (CMS/HHS-HCC)  . Numbness and tingling of both feet  . Leg weakness, bilateral  . Nodule of left lung    Outpatient Medications Marked as Taking for the 07/29/23 encounter (Same Day Visit) with Delfina Darice Nice, MD  Medication Sig Dispense Refill  . albuterol  MDI, PROVENTIL , VENTOLIN ,  PROAIR , HFA (PROAIR  HFA) 90 mcg/actuation inhaler Inhale 2 inhalations into the lungs every 4 (four) hours as needed for Wheezing 8.5 g 5  . budesonide-formoteroL  (SYMBICORT) 160-4.5 mcg/actuation inhaler Inhale 2 inhalations into the lungs 2 (two) times daily 10.2 g 3  . lisinopriL (ZESTRIL) 10 MG tablet Take 1 tablet (10 mg total) by mouth once daily for 90 days 30 tablet 2  . omeprazole (PRILOSEC) 40 MG DR capsule Take 1 capsule (40 mg total) by mouth once daily 30 capsule 11    Allergies  Allergen Reactions  . Hydrocodone-Acetaminophen  Nausea And Vomiting  . Tramadol-Acetaminophen  Nausea And Vomiting    Past Medical History:  Diagnosis Date  . Abdominal pain, recurrent 10/09/2015  . Cervicalgia 04/05/2012  . Chronic bilateral low back pain without sciatica 10/09/2015  . Chronic depression 10/09/2015  . COPD with emphysema (CMS-HCC) 10/09/2015  . Essential tremor 12/31/2011  . Gastroesophageal reflux disease with esophagitis 10/09/2015   Since teenager  . Generalized anxiety disorder 12/15/2012  . Hypertension 10/12/2008  . Tobacco abuse 10/09/2015    Past Surgical History:  Procedure Laterality Date  . COLONOSCOPY N/A 05/22/2019   Procedure: COLORECTAL CANCER SCREENING; COLONOSCOPY ON INDIVIDUAL NOT MEETING CRITERIA FOR HIGH RISK;  Surgeon: Elta Fonda Mt, MD;  Location: [facility];  Service: Gastroenterology;  Laterality: N/A;  . FRACTURE SURGERY Right    ankle  . HERNIA REPAIR Bilateral    inguinal   No family history on file.  Social History   Tobacco Use   . Smoking status: Every Day    Current packs/day: 0.50    Average packs/day: 0.5 packs/day for 40.8 years (20.4 ttl pk-yrs)    Types: Cigarettes    Start date: 10/12/1982  . Smokeless tobacco: Never  . Tobacco comments:    started at age 50   Vaping Use  . Vaping status: Never Used  Substance Use Topics  . Alcohol use: Yes    Alcohol/week: 3.0 standard drinks of alcohol    Types: 3 Shots of liquor per week    Comment: nightly  . Drug use: No     ROS:  See HPI Objective:   Vitals:   07/29/23 1057 07/29/23 1103  BP: (!) 164/87 (!) 158/80  Pulse: 87 69  Weight:  59 kg (130 lb 1.1 oz)  PainSc: 10-Worst pain ever   PainLoc: Generalized    Body mass index is 17.16 kg/m.  Constit-agitated with rushed speech and appears to be in pain with minimal touch Eyes- anicteric, PERRL, sclera clear, lids normal Neck- Supple, no masses, shotty cervical lymphadenopathy Cor- Regular rate and rhythm, normal S1S2, no murmurs but distant heart sounds Lungs- clear to ascultation bilaterally, no rhonchi, wheeze but poor air exchange Abd- Soft, non tender, non distended, positive bowel sounds, no masses or hepatosplenomegaly  MS-tenderness over upper back and shoulders with light touch, full range of motion and gait normal Neuro-hyperreflexic upper extremities and lower extremities bilaterally, tremor upper extremities bilaterally Skin- No rashes in visible areas, warm, dry, good turgor Psych-alert and oriented 3, agitated appearing    Assessment/Plan:   Diagnoses and all orders for this visit:  Essential hypertension -     Comprehensive Metabolic Panel (CMP); Future -     lisinopriL (ZESTRIL) 10 MG tablet; Take 1 tablet (10 mg total) by mouth once daily for 90 days Assessment: Blood pressure under poor control. Factors affecting control of BP include pain and has not taken medication for about 2 years. Plan: -  Begin  lisinopril (Zestril/Prinivil) without HCTZ -  Check CMP, TSH, and  cbc -  Encouraged dietary sodium restriction/DASH diet -  Recommended regular aerobic exercise. -  Recommend home blood pressure monitoring, to bring results in next visit -  Discussed need for med compliance -  Reviewed risks of HTN, principles of treatment and consequences of untreated HTN -  Goal of BP <130/80 Pulmonary emphysema, unspecified emphysema type (CMS/HHS-HCC) -     albuterol  MDI, PROVENTIL , VENTOLIN , PROAIR , HFA (PROAIR  HFA) 90 mcg/actuation inhaler; Inhale 2 inhalations into the lungs every 4 (four) hours as needed for Wheezing -     budesonide-formoteroL  (SYMBICORT) 160-4.5 mcg/actuation inhaler; Inhale 2 inhalations into the lungs 2 (two) times daily Worse.  Restart albuterol  and Symbicort.  Use Symbicort twice a day. Nodule of left lung Will address at next visit.  Needs repeat CT scan. Tobacco abuse Not willing to make changes at this time.  Encouraged him to think about quitting. Chronic depression -     Depression Screen -(PHQ- 2/9, BDI) -     Ambulatory Referral To Primary Care Behavioral Health [facility] Only - 29yrs and older) Ongoing.  Referred to behavioral health Generalized anxiety disorder -     Anxiety Screen [NUR6106] -     Ambulatory Referral To Primary Care Behavioral Health [facility] Only - 11yrs and older) Ongoing.  Referred to behavioral health for medication management and therapy Essential tremor -     Complete Blood Count (CBC) with Differential; Future -     Thyroid  Stimulating Hormone (TSH); Future -     Ambulatory Referral to Neurology -     Vitamin B12; Future Worse.  Unclear if this is related to his cervical cord issue but has been present for many years.  Refer to neurology.  Check labs as above. Cervical cord myelomalacia (CMS/HHS-HCC) Numbness and tingling of both feet Leg weakness, bilateral -     Ambulatory Referral to Neurology Ongoing issues.  Present for many years.  Patient states worse lately. Gastroesophageal reflux disease with  esophagitis without hemorrhage -     omeprazole (PRILOSEC) 40 MG DR capsule; Take 1 capsule (40 mg total) by mouth once daily Worse.  Restart Prilosec once a day.  Advised to take any NSAID or steroid with food in his stomach. Cervicalgia Other cervical disc degeneration, unspecified cervical region Chronic bilateral low back pain without sciatica Chronic left shoulder pain -     Ambulatory Referral to Orthopedic Surgery Ongoing and worsening.  Refer to orthopedic surgery.  Has had numerous falls in the past and likely severe arthritis   Screening for cardiovascular condition -     Lipid Panel W/Reflex Direct Low Density Lipoprotein (LDL) Cholesterol; Future  Chest pain at rest -     ECG 12-lead EKG reassuringly normal.  Advised to go to emergency department if his chest pain is accompanied by nausea and shortness of breath and concern for heart attack.  Check cholesterol.  Restart lisinopril for hypertension. Multiple joint pain -     Rheumatoid Factor; Future -     Anti-Nuclear Antibody Screen; Future -     C-Reactive Protein (CRP), Inflammatory; Future -     Sedimentation Rate-Automated; Future -     Creatine Kinase (CK), Total; Future -     Uric Acid; Future -     Cyclic Citrullinated Peptide Abs; Future -     Ambulatory Referral to Orthopedic Surgery -     predniSONE (DELTASONE) 5 MG tablet; 12:1 tapering dose. Take 12  tabs today, 11 tabs tomorrow, etc until gone Ongoing and worse.  Start prednisone.  Check labs as above to evaluate for inflammatory arthritis.  F/u 2 weeks  Future Appointments     Date/Time Provider Department Center Visit Type   08/17/2023 3:20 PM (Arrive by 3:05 PM) Delfina Darice Nice, MD [facility] OFFICE VISIT       Patient Instructions  For your pain, take the anti-inflammatory ibuprofen  800 mg with food 4 times a day to help with inflammation and pain OR aleve 220 mg 2 tabs twice a day with food.  If prescribed an  anti-inflammatory like meloxicam/mobic or diclofenac/voltaran or prednisone, do not take the above medications and do not take aspirin or BC powder with any of these unless you are taking a daily dose of aspirin in which case you should continue to take that.  You can take tylenol /acetaminophen  in addition to the above medications but max dose is 1000 mg three times per day.  Do not exceed this dose since it can cause liver damage. Use a cold pack applied to the area 10 minutes at least three times per day for the first 24 hours of any injury and then use a heating pad for 10 minutes at least three times per day after that.  If you have been told that you have kidney failure at any stage then be careful with use of nonsteroidal anti-inflammatory medications.  You should limit the dose and the length of time you take it as much as possible.  We will call you tomorrow with lab results if there is any concern otherwise we will send a letter with those results.  I have referred you to our telehealth service for mental health.  They will call you.  You do not need to go anywhere.  They can be helpful with medication management and therapy.  Measure your blood pressure at home! Home Blood Pressure Monitoring is an important part of managing blood pressure and thought to be more accurate than the measures we get in the clinic.   Here's some tips on how to take your blood pressure accurately at home and some highly rated monitors. Most insurances (except for Medicaid) won't pay for monitors, so unfortunately they are an out-of-pocket expense for most people.   Taking an accurate blood pressure measurement: To get an accurate blood pressure reading, empty your bladder first, then rest in a seated position for at least 5 minutes. Ideally, no caffeine or tobacco use in last 30 minutes. Use an arm cuff (not wrist - see recommendations below) seated in a chair with a back next to a table or object that is high enough  that you can rest your arm so the blood pressure cuff is at the level of your heart and you can lean back comfortably. Keep both feet on the floor and don't talk while the machine is working. Checking at different times of the day can be helpful to get an idea of your average numbers. Your goal blood pressure should be  optimally at 120/80  and if persistently higher than 130/85 you may need to start medication or adjust your medications.  Please contact me if this is occurring.   BP Monitor Ratings: Here are some top rated Blood Pressure Monitors as tested by Consumer Reports (accessed 05/2020): (*BB) Best buy = highly rated with lower price Rating Brand/Model Estimated Cost  86 [employer] Platinum BP5450 $81  85 [employer]ron Silver BP5250 ADELINE  BB)  $50  83 [employer]ron Evolv BP7000 $70  81 [employer] [id number] (*BB) $45  79 [employer] Delux Automatic BP3AR1-4DRITE (*BB) $37  78 [employer] [id number] $40  75 [employer]ate ([employer]) 4000  $29  Note: all units listed above are arm cuffs which are more accurate than wrist cuffs.  For more information (and the source of the below info-graphic on how to get set up to get an accurate reading) - check out: superiormarketers.be    *Some images could not be shown.

## 2023-08-02 NOTE — Result Encounter Note (Signed)
 Letter sent to patient with lab results.

## 2023-08-31 NOTE — Progress Notes (Signed)
 [facility] [employer] was not able to reach the patient by phone call.  The details of interventions are as follows:  Preventive Care-     08/31/2023    1:56 PM  Gap Closure  Age Group: Adult  Adult Yearly Physical Interventions Unable to reach - MyChart Unavailable    KRISTIE DAVIS  For more information on [facility] services, click here.

## 2023-12-01 NOTE — Progress Notes (Signed)
 [facility] [employer] was not able to reach the patient by phone call and MyChart.  The details of interventions are as follows:  Preventive Care-     12/01/2023   10:03 AM  Gap Closure  Age Group: Adult  Adult Yearly Physical Interventions Unable to reach - MyChart Unavailable  Comments: person who answered the ph stated pt hasn't been there in a while and she dosn't have away to contact him    ANDREA EPPS  For more information on [facility] services, click here.

## 2024-03-10 NOTE — Progress Notes (Signed)
 [facility] [employer] was not able to reach the patient by phone call.  The details of interventions are as follows:  Preventive Care-     03/10/2024   10:26 AM  Gap Closure  Age Group: Adult  Adult Primary Care Office Visit Unable to reach - MyChart Unavailable  Comments: person that answered stated she hasn't seen pt in years and her number is not a good # to try and reach him [phone]    ASHLEY TRIPP  For more information on [facility] services, click here.

## 2024-08-13 ENCOUNTER — Other Ambulatory Visit: Payer: Self-pay

## 2024-08-13 ENCOUNTER — Emergency Department: Admitting: Anesthesiology

## 2024-08-13 ENCOUNTER — Inpatient Hospital Stay
Admission: EM | Admit: 2024-08-13 | Discharge: 2024-08-21 | DRG: 326 | Disposition: A | Discharge status: Home / Self Care | Attending: Student in an Organized Health Care Education/Training Program | Admitting: Student in an Organized Health Care Education/Training Program

## 2024-08-13 ENCOUNTER — Encounter
Admission: EM | Disposition: A | Discharge status: Home / Self Care | Payer: Self-pay | Source: Home / Self Care | Attending: Internal Medicine

## 2024-08-13 ENCOUNTER — Emergency Department

## 2024-08-13 ENCOUNTER — Inpatient Hospital Stay

## 2024-08-13 DIAGNOSIS — K659 Peritonitis, unspecified: Secondary | ICD-10-CM | POA: Diagnosis present

## 2024-08-13 DIAGNOSIS — K255 Chronic or unspecified gastric ulcer with perforation: Principal | ICD-10-CM | POA: Diagnosis present

## 2024-08-13 DIAGNOSIS — K2100 Gastro-esophageal reflux disease with esophagitis, without bleeding: Secondary | ICD-10-CM | POA: Diagnosis not present

## 2024-08-13 DIAGNOSIS — Z681 Body mass index (BMI) 19 or less, adult: Secondary | ICD-10-CM | POA: Diagnosis not present

## 2024-08-13 DIAGNOSIS — I10 Essential (primary) hypertension: Secondary | ICD-10-CM | POA: Diagnosis present

## 2024-08-13 DIAGNOSIS — J449 Chronic obstructive pulmonary disease, unspecified: Secondary | ICD-10-CM | POA: Diagnosis present

## 2024-08-13 DIAGNOSIS — K668 Other specified disorders of peritoneum: Principal | ICD-10-CM

## 2024-08-13 DIAGNOSIS — K567 Ileus, unspecified: Secondary | ICD-10-CM | POA: Diagnosis not present

## 2024-08-13 DIAGNOSIS — F1010 Alcohol abuse, uncomplicated: Secondary | ICD-10-CM | POA: Diagnosis present

## 2024-08-13 DIAGNOSIS — K219 Gastro-esophageal reflux disease without esophagitis: Secondary | ICD-10-CM | POA: Diagnosis present

## 2024-08-13 DIAGNOSIS — E43 Unspecified severe protein-calorie malnutrition: Secondary | ICD-10-CM | POA: Diagnosis present

## 2024-08-13 DIAGNOSIS — Z23 Encounter for immunization: Secondary | ICD-10-CM | POA: Diagnosis not present

## 2024-08-13 DIAGNOSIS — Z79899 Other long term (current) drug therapy: Secondary | ICD-10-CM | POA: Diagnosis not present

## 2024-08-13 DIAGNOSIS — R100 Acute abdomen: Secondary | ICD-10-CM | POA: Diagnosis not present

## 2024-08-13 DIAGNOSIS — R1084 Generalized abdominal pain: Secondary | ICD-10-CM

## 2024-08-13 DIAGNOSIS — G2581 Restless legs syndrome: Secondary | ICD-10-CM | POA: Diagnosis present

## 2024-08-13 DIAGNOSIS — F17210 Nicotine dependence, cigarettes, uncomplicated: Secondary | ICD-10-CM | POA: Diagnosis present

## 2024-08-13 DIAGNOSIS — K251 Acute gastric ulcer with perforation: Secondary | ICD-10-CM | POA: Diagnosis not present

## 2024-08-13 HISTORY — PX: LAPAROSCOPY, DIAGNOSTIC, ROBOT-ASSISTED: SHX7606

## 2024-08-13 LAB — COMPREHENSIVE METABOLIC PANEL WITH GFR
ALT: 15 U/L (ref 0–44)
AST: 23 U/L (ref 15–41)
Albumin: 3.6 g/dL (ref 3.5–5.0)
Alkaline Phosphatase: 67 U/L (ref 38–126)
Anion gap: 10 (ref 5–15)
BUN: 25 mg/dL — ABNORMAL HIGH (ref 6–20)
CO2: 26 mmol/L (ref 22–32)
Calcium: 8.5 mg/dL — ABNORMAL LOW (ref 8.9–10.3)
Chloride: 104 mmol/L (ref 98–111)
Creatinine, Ser: 0.8 mg/dL (ref 0.61–1.24)
GFR, Estimated: >60 mL/min (ref >60)
Glucose, Bld: 93 mg/dL (ref 70–99)
Potassium: 4 mmol/L (ref 3.5–5.1)
Sodium: 140 mmol/L (ref 135–145)
Total Bilirubin: 1 mg/dL (ref 0.0–1.2)
Total Protein: 6.7 g/dL (ref 6.5–8.1)

## 2024-08-13 LAB — CBC
HCT: 41.8 % (ref 39.0–52.0)
Hemoglobin: 14.2 g/dL (ref 13.0–17.0)
MCH: 34.4 pg — ABNORMAL HIGH (ref 26.0–34.0)
MCHC: 34 g/dL (ref 30.0–36.0)
MCV: 101.2 fL — ABNORMAL HIGH (ref 80.0–100.0)
Platelets: 306 K/uL (ref 150–400)
RBC: 4.13 MIL/uL — ABNORMAL LOW (ref 4.22–5.81)
RDW: 12.1 % (ref 11.5–15.5)
WBC: 7.4 K/uL (ref 4.0–10.5)
nRBC: 0 % (ref 0.0–0.2)

## 2024-08-13 LAB — LIPASE, BLOOD: Lipase: 33 U/L (ref 11–51)

## 2024-08-13 LAB — PROTIME-INR
INR: 1.5 — ABNORMAL HIGH (ref 0.8–1.2)
Prothrombin Time: 18.7 s — ABNORMAL HIGH (ref 11.4–15.2)

## 2024-08-13 LAB — GLUCOSE, CAPILLARY: Glucose-Capillary: 121 mg/dL — ABNORMAL HIGH (ref 70–99)

## 2024-08-13 LAB — ETHANOL: Alcohol, Ethyl (B): <15 mg/dL (ref <15)

## 2024-08-13 SURGERY — LAPAROSCOPY, DIAGNOSTIC, ROBOT-ASSISTED
Anesthesia: General | Site: Abdomen

## 2024-08-13 MED ORDER — ENOXAPARIN SODIUM 40 MG/0.4ML IJ SOSY
40.0000 mg | PREFILLED_SYRINGE | INTRAMUSCULAR | Status: DC
  Administered 2024-08-14 – 2024-08-21 (×8): 40 mg via SUBCUTANEOUS
  Filled 2024-08-13 (×8): qty 0.4

## 2024-08-13 MED ORDER — LACTATED RINGERS IV SOLN: INTRAVENOUS | Status: DC | PRN

## 2024-08-13 MED ORDER — KETAMINE HCL 50 MG/5ML IJ SOSY
PREFILLED_SYRINGE | INTRAMUSCULAR | Status: AC
  Filled 2024-08-13: qty 5

## 2024-08-13 MED ORDER — LACTATED RINGERS IV SOLN: INTRAVENOUS | Status: AC

## 2024-08-13 MED ORDER — METHOCARBAMOL 1000 MG/10ML IJ SOLN
500.0000 mg | Freq: Three times a day (TID) | INTRAMUSCULAR | Status: DC
  Administered 2024-08-13 – 2024-08-21 (×23): 500 mg via INTRAVENOUS
  Filled 2024-08-13 (×5): qty 10
  Filled 2024-08-13 (×2): qty 5
  Filled 2024-08-13 (×5): qty 10
  Filled 2024-08-13: qty 5
  Filled 2024-08-13 (×11): qty 10

## 2024-08-13 MED ORDER — IPRATROPIUM-ALBUTEROL 0.5-2.5 (3) MG/3ML IN SOLN: 3.0000 mL | Freq: Four times a day (QID) | RESPIRATORY_TRACT | Status: AC | PRN

## 2024-08-13 MED ORDER — IOHEXOL 300 MG/ML  SOLN
100.0000 mL | Freq: Once | INTRAMUSCULAR | Status: AC | PRN
  Administered 2024-08-13: 100 mL via INTRAVENOUS

## 2024-08-13 MED ORDER — DEXAMETHASONE SOD PHOSPHATE PF 10 MG/ML IJ SOLN
INTRAMUSCULAR | Status: DC | PRN
  Administered 2024-08-13: 6 mg via INTRAVENOUS

## 2024-08-13 MED ORDER — EPHEDRINE SULFATE-NACL 50-0.9 MG/10ML-% IV SOSY
PREFILLED_SYRINGE | INTRAVENOUS | Status: DC | PRN
  Administered 2024-08-13 (×2): 10 mg via INTRAVENOUS

## 2024-08-13 MED ORDER — 0.9 % SODIUM CHLORIDE (POUR BTL) OPTIME
TOPICAL | Status: DC | PRN
  Administered 2024-08-13: 1000 mL

## 2024-08-13 MED ORDER — BUPIVACAINE-EPINEPHRINE (PF) 0.5% -1:200000 IJ SOLN
INTRAMUSCULAR | Status: DC | PRN
  Administered 2024-08-13: 30 mL via PERINEURAL

## 2024-08-13 MED ORDER — LORAZEPAM 2 MG/ML IJ SOLN: 1.0000 mg | INTRAMUSCULAR | Status: AC | PRN

## 2024-08-13 MED ORDER — FOLIC ACID 1 MG PO TABS: 1.0000 mg | ORAL_TABLET | Freq: Every day | ORAL | Status: DC

## 2024-08-13 MED ORDER — THIAMINE MONONITRATE 100 MG PO TABS: 100.0000 mg | ORAL_TABLET | Freq: Every day | ORAL | Status: DC

## 2024-08-13 MED ORDER — PHENYLEPHRINE HCL-NACL 20-0.9 MG/250ML-% IV SOLN
INTRAVENOUS | Status: DC | PRN
  Administered 2024-08-13: 50 ug/min via INTRAVENOUS

## 2024-08-13 MED ORDER — ROCURONIUM BROMIDE 100 MG/10ML IV SOLN
INTRAVENOUS | Status: DC | PRN
  Administered 2024-08-13: 40 mg via INTRAVENOUS
  Administered 2024-08-13: 20 mg via INTRAVENOUS

## 2024-08-13 MED ORDER — HYDROMORPHONE HCL 1 MG/ML IJ SOLN
0.5000 mg | INTRAMUSCULAR | Status: DC | PRN
  Administered 2024-08-13 – 2024-08-21 (×41): 0.5 mg via INTRAVENOUS
  Filled 2024-08-13 (×11): qty 0.5
  Filled 2024-08-13: qty 1
  Filled 2024-08-13 (×3): qty 0.5
  Filled 2024-08-13: qty 1
  Filled 2024-08-13 (×8): qty 0.5
  Filled 2024-08-13: qty 1
  Filled 2024-08-13 (×14): qty 0.5
  Filled 2024-08-13: qty 1
  Filled 2024-08-13 (×4): qty 0.5

## 2024-08-13 MED ORDER — FENTANYL CITRATE (PF) 100 MCG/2ML IJ SOLN: 25.0000 ug | INTRAMUSCULAR | Status: DC | PRN

## 2024-08-13 MED ORDER — SUGAMMADEX SODIUM 200 MG/2ML IV SOLN
INTRAVENOUS | Status: DC | PRN
  Administered 2024-08-13: 150 mg via INTRAVENOUS

## 2024-08-13 MED ORDER — HYDROMORPHONE HCL 1 MG/ML IJ SOLN
1.0000 mg | Freq: Once | INTRAMUSCULAR | Status: AC
  Administered 2024-08-13: 1 mg via INTRAVENOUS
  Filled 2024-08-13: qty 1

## 2024-08-13 MED ORDER — SUCCINYLCHOLINE CHLORIDE 200 MG/10ML IV SOSY
PREFILLED_SYRINGE | INTRAVENOUS | Status: DC | PRN
  Administered 2024-08-13: 140 mg via INTRAVENOUS

## 2024-08-13 MED ORDER — ACETAMINOPHEN 10 MG/ML IV SOLN
INTRAVENOUS | Status: DC | PRN
  Administered 2024-08-13: 1000 mg via INTRAVENOUS

## 2024-08-13 MED ORDER — ACETAMINOPHEN 10 MG/ML IV SOLN
INTRAVENOUS | Status: AC
  Filled 2024-08-13: qty 100

## 2024-08-13 MED ORDER — PIPERACILLIN-TAZOBACTAM 3.375 G IVPB 30 MIN
3.3750 g | Freq: Once | INTRAVENOUS | Status: AC
  Administered 2024-08-13: 3.375 g via INTRAVENOUS
  Filled 2024-08-13: qty 50

## 2024-08-13 MED ORDER — OXYCODONE HCL 5 MG PO TABS: 5.0000 mg | ORAL_TABLET | Freq: Once | ORAL | Status: DC | PRN

## 2024-08-13 MED ORDER — MIDAZOLAM HCL 2 MG/2ML IJ SOLN
INTRAMUSCULAR | Status: AC
  Filled 2024-08-13: qty 2

## 2024-08-13 MED ORDER — ONDANSETRON HCL 4 MG/2ML IJ SOLN: 4.0000 mg | Freq: Once | INTRAMUSCULAR | Status: DC

## 2024-08-13 MED ORDER — PIPERACILLIN-TAZOBACTAM 3.375 G IVPB
3.3750 g | Freq: Three times a day (TID) | INTRAVENOUS | Status: AC
  Administered 2024-08-13 – 2024-08-18 (×16): 3.375 g via INTRAVENOUS
  Filled 2024-08-13 (×16): qty 50

## 2024-08-13 MED ORDER — ACETAMINOPHEN 10 MG/ML IV SOLN: 1000.0000 mg | Freq: Once | INTRAVENOUS | Status: DC | PRN

## 2024-08-13 MED ORDER — PHENYLEPHRINE 80 MCG/ML (10ML) SYRINGE FOR IV PUSH (FOR BLOOD PRESSURE SUPPORT)
PREFILLED_SYRINGE | INTRAVENOUS | Status: DC | PRN
  Administered 2024-08-13: 80 ug via INTRAVENOUS
  Administered 2024-08-13 (×2): 160 ug via INTRAVENOUS
  Administered 2024-08-13: 80 ug via INTRAVENOUS

## 2024-08-13 MED ORDER — ADULT MULTIVITAMIN W/MINERALS CH: 1.0000 | ORAL_TABLET | Freq: Every day | ORAL | Status: DC

## 2024-08-13 MED ORDER — PANTOPRAZOLE SODIUM 40 MG IV SOLR
40.0000 mg | Freq: Two times a day (BID) | INTRAVENOUS | Status: DC
  Administered 2024-08-13 – 2024-08-17 (×9): 40 mg via INTRAVENOUS
  Filled 2024-08-13 (×9): qty 10

## 2024-08-13 MED ORDER — DROPERIDOL 2.5 MG/ML IJ SOLN: 0.6250 mg | Freq: Once | INTRAMUSCULAR | Status: DC | PRN

## 2024-08-13 MED ORDER — OXYCODONE HCL 5 MG/5ML PO SOLN: 5.0000 mg | Freq: Once | ORAL | Status: DC | PRN

## 2024-08-13 MED ORDER — FLUCONAZOLE IN SODIUM CHLORIDE 400-0.9 MG/200ML-% IV SOLN
400.0000 mg | Freq: Once | INTRAVENOUS | Status: DC
Start: 2024-08-13 — End: 2024-08-18
  Filled 2024-08-13: qty 200

## 2024-08-13 MED ORDER — THIAMINE HCL 100 MG/ML IJ SOLN: 100.0000 mg | Freq: Every day | INTRAMUSCULAR | Status: DC

## 2024-08-13 MED ORDER — HYDRALAZINE HCL 20 MG/ML IJ SOLN: 5.0000 mg | Freq: Four times a day (QID) | INTRAMUSCULAR | Status: AC | PRN

## 2024-08-13 MED ORDER — PHENYLEPHRINE HCL-NACL 20-0.9 MG/250ML-% IV SOLN
INTRAVENOUS | Status: AC
  Filled 2024-08-13: qty 250

## 2024-08-13 MED ORDER — FENTANYL CITRATE (PF) 100 MCG/2ML IJ SOLN
INTRAMUSCULAR | Status: DC | PRN
  Administered 2024-08-13: 25 ug via INTRAVENOUS
  Administered 2024-08-13: 75 ug via INTRAVENOUS

## 2024-08-13 MED ORDER — PROPOFOL 10 MG/ML IV BOLUS
INTRAVENOUS | Status: DC | PRN
  Administered 2024-08-13: 130 mg via INTRAVENOUS

## 2024-08-13 MED ORDER — LIDOCAINE HCL (CARDIAC) PF 100 MG/5ML IV SOSY
PREFILLED_SYRINGE | INTRAVENOUS | Status: DC | PRN
  Administered 2024-08-13: 80 mg via INTRAVENOUS

## 2024-08-13 MED ORDER — LORAZEPAM 2 MG/ML IJ SOLN
1.0000 mg | Freq: Once | INTRAMUSCULAR | Status: AC
  Administered 2024-08-13: 1 mg via INTRAVENOUS
  Filled 2024-08-13: qty 1

## 2024-08-13 MED ORDER — MORPHINE SULFATE (PF) 4 MG/ML IV SOLN
4.0000 mg | Freq: Once | INTRAVENOUS | Status: AC
  Administered 2024-08-13: 4 mg via INTRAVENOUS
  Filled 2024-08-13: qty 1

## 2024-08-13 MED ORDER — ONDANSETRON HCL 4 MG/2ML IJ SOLN
INTRAMUSCULAR | Status: DC | PRN
  Administered 2024-08-13: 4 mg via INTRAVENOUS

## 2024-08-13 MED ORDER — METHYLENE BLUE (ANTIDOTE) 1 % IV SOLN
INTRAVENOUS | Status: AC
  Filled 2024-08-13: qty 10

## 2024-08-13 MED ORDER — LORAZEPAM 1 MG PO TABS: 1.0000 mg | ORAL_TABLET | ORAL | Status: DC | PRN

## 2024-08-13 MED ORDER — MIDAZOLAM HCL (PF) 2 MG/2ML IJ SOLN
INTRAMUSCULAR | Status: DC | PRN
  Administered 2024-08-13: 2 mg via INTRAVENOUS

## 2024-08-13 MED ORDER — KETAMINE HCL 50 MG/5ML IJ SOSY
PREFILLED_SYRINGE | INTRAMUSCULAR | Status: DC | PRN
  Administered 2024-08-13: 20 mg via INTRAVENOUS
  Administered 2024-08-13: 30 mg via INTRAVENOUS

## 2024-08-13 MED ORDER — ONDANSETRON HCL 4 MG/2ML IJ SOLN: 4.0000 mg | INTRAMUSCULAR | Status: DC | PRN

## 2024-08-13 MED ORDER — SODIUM CHLORIDE 0.9 % IV BOLUS
1000.0000 mL | Freq: Once | INTRAVENOUS | Status: AC
  Administered 2024-08-13: 1000 mL via INTRAVENOUS

## 2024-08-13 MED ORDER — ACETAMINOPHEN 10 MG/ML IV SOLN
1000.0000 mg | Freq: Four times a day (QID) | INTRAVENOUS | Status: DC
Start: 2024-08-13 — End: 2024-08-13
  Filled 2024-08-13 (×2): qty 100

## 2024-08-13 MED ORDER — FENTANYL CITRATE (PF) 100 MCG/2ML IJ SOLN
INTRAMUSCULAR | Status: AC
  Filled 2024-08-13: qty 2

## 2024-08-13 MED ORDER — ACETAMINOPHEN 10 MG/ML IV SOLN
1000.0000 mg | Freq: Four times a day (QID) | INTRAVENOUS | Status: AC
  Administered 2024-08-14 (×4): 1000 mg via INTRAVENOUS
  Filled 2024-08-13 (×4): qty 100

## 2024-08-13 SURGICAL SUPPLY — 80 items
BAG PRESSURE INF REUSE 1000 (BAG) ×1 IMPLANT
BLADE SURG 10 STRL SS (BLADE) ×1 IMPLANT
CANNULA REDUCER 12-8 DVNC XI (CANNULA) ×2 IMPLANT
CHLORAPREP W/TINT 26 (MISCELLANEOUS) ×1 IMPLANT
COVER TIP SHEARS 8 DVNC (MISCELLANEOUS) ×2 IMPLANT
DEFOGGER SCOPE WARM SEASHARP (MISCELLANEOUS) ×2 IMPLANT
DERMABOND ADVANCED .7 DNX12 (GAUZE/BANDAGES/DRESSINGS) ×2 IMPLANT
DERMABOND ADVANCED .7 DNX6 (GAUZE/BANDAGES/DRESSINGS) ×1 IMPLANT
DRAIN CHANNEL JP 19F RND 3/16 (MISCELLANEOUS) ×1 IMPLANT
DRAPE ARM DVNC X/XI (DISPOSABLE) ×8 IMPLANT
DRAPE COLUMN DVNC XI (DISPOSABLE) ×3 IMPLANT
DRAPE LAPAROTOMY 100X77 ABD (DRAPES) ×2 IMPLANT
DRAPE WARM FLUID 44X44 (DRAPES) ×2 IMPLANT
DRSG OPSITE POSTOP 4X10 (GAUZE/BANDAGES/DRESSINGS) IMPLANT
DRSG OPSITE POSTOP 4X8 (GAUZE/BANDAGES/DRESSINGS) IMPLANT
DRSG TEGADERM 4X4.75 (GAUZE/BANDAGES/DRESSINGS) ×1 IMPLANT
ELECT BLADE 6.5 EXT (BLADE) ×2 IMPLANT
ELECTRODE REM PT RTRN 9FT ADLT (ELECTROSURGICAL) ×2 IMPLANT
EVACUATOR SILICONE 100CC (DRAIN) ×1 IMPLANT
FORCEPS BPLR 8 MD DVNC XI (FORCEP) ×1 IMPLANT
FORCEPS BPLR FENES DVNC XI (FORCEP) ×2 IMPLANT
GLOVE BIOGEL PI IND STRL 6.5 (GLOVE) ×4 IMPLANT
GLOVE BIOGEL PI IND STRL 7.5 (GLOVE) ×2 IMPLANT
GLOVE SURG SYN 6.5 PF PI (GLOVE) ×4 IMPLANT
GLOVE SURG SYN 7.0 PF PI (GLOVE) ×2 IMPLANT
GOWN STRL REUS W/ TWL LRG LVL3 (GOWN DISPOSABLE) ×6 IMPLANT
GRASPER LAPSCPC 5X45 DSP (INSTRUMENTS) ×1 IMPLANT
GRASPER SUT TROCAR 14GX15 (MISCELLANEOUS) IMPLANT
GRASPER TIP-UP FEN DVNC XI (INSTRUMENTS) ×3 IMPLANT
HANDLE YANKAUER SUCT BULB TIP (MISCELLANEOUS) ×1 IMPLANT
IRRIGATOR SUCT 8 DISP DVNC XI (IRRIGATION / IRRIGATOR) ×1 IMPLANT
IV 0.9% NACL 1000 ML (IV SOLUTION) ×2 IMPLANT
KIT PINK PAD W/HEAD ARM REST (MISCELLANEOUS) ×2 IMPLANT
KIT TURNOVER KIT A (KITS) ×2 IMPLANT
LABEL OR SOLS (LABEL) ×2 IMPLANT
LIGASURE IMPACT 36 18CM CVD LR (INSTRUMENTS) IMPLANT
MANIFOLD NEPTUNE II (INSTRUMENTS) ×2 IMPLANT
NDL DRIVE SUT CUT DVNC (INSTRUMENTS) ×1 IMPLANT
NDL HYPO 22X1.5 SAFETY MO (MISCELLANEOUS) ×1 IMPLANT
NDL INSUFFLATION 14GA 120MM (NEEDLE) ×1 IMPLANT
NEEDLE DRIVE SUT CUT DVNC (INSTRUMENTS) ×2 IMPLANT
NEEDLE HYPO 22X1.5 SAFETY MO (MISCELLANEOUS) ×2 IMPLANT
NEEDLE INSUFFLATION 14GA 120MM (NEEDLE) ×2 IMPLANT
OBTURATOR OPTICALSTD 8 DVNC (TROCAR) ×2 IMPLANT
PACK BASIN MAJOR ARMC (MISCELLANEOUS) ×2 IMPLANT
PACK COLON CLEAN CLOSURE (MISCELLANEOUS) ×2 IMPLANT
PACK LAP CHOLECYSTECTOMY (MISCELLANEOUS) ×2 IMPLANT
RELOAD STAPLE 45 2.5 WHT DVNC (STAPLE) IMPLANT
RELOAD STAPLE 45 3.5 BLU DVNC (STAPLE) IMPLANT
RELOAD STAPLE 75 3.8 BLU REG (ENDOMECHANICALS) IMPLANT
RETRACTOR WND ALEXIS-O 25 LRG (MISCELLANEOUS) IMPLANT
RETRACTOR WOUND ALXS 18CM MED (MISCELLANEOUS) IMPLANT
SCISSORS MNPLR CVD DVNC XI (INSTRUMENTS) ×2 IMPLANT
SEAL UNIV 5-12 XI (MISCELLANEOUS) ×8 IMPLANT
SEALER VESSEL EXT DVNC XI (MISCELLANEOUS) IMPLANT
SET TUBE SMOKE EVAC HIGH FLOW (TUBING) ×3 IMPLANT
SOLN 0.9% NACL POUR BTL 1000ML (IV SOLUTION) ×3 IMPLANT
SOLUTION ELECTROSURG ANTI STCK (MISCELLANEOUS) ×2 IMPLANT
SPONGE DRAIN TRACH 4X4 STRL 2S (GAUZE/BANDAGES/DRESSINGS) ×1 IMPLANT
SPONGE T-LAP 4X18 ~~LOC~~+RFID (SPONGE) ×2 IMPLANT
STAPLER 45 SUREFORM DVNC (STAPLE) IMPLANT
STAPLER GUN LINEAR PROX 60 (STAPLE) IMPLANT
STAPLER PROXIMATE 75MM BLUE (STAPLE) IMPLANT
STAPLER SKIN PROX 35W (STAPLE) ×2 IMPLANT
SUT MNCRL AB 4-0 PS2 18 (SUTURE) ×2 IMPLANT
SUT PDS AB 0 CT1 27 (SUTURE) IMPLANT
SUT SILK 2 0 SH (SUTURE) ×4 IMPLANT
SUT SILK 2-0 18XBRD TIE 12 (SUTURE) IMPLANT
SUT SILK 3 0 SH CR/8 (SUTURE) IMPLANT
SUT STRATA 3-0 23 RB-1.5 (SUTURE) ×2 IMPLANT
SUT VIC AB 2-0 SH 27XBRD (SUTURE) ×2 IMPLANT
SUT VIC AB 3-0 SH 27X BRD (SUTURE) ×2 IMPLANT
SUT VICRYL 0 UR6 27IN ABS (SUTURE) ×2 IMPLANT
SUTURE EHLN 3-0 FS-10 30 BLK (SUTURE) ×1 IMPLANT
SYR 20ML LL LF (SYRINGE) ×4 IMPLANT
SYR 30ML LL (SYRINGE) ×2 IMPLANT
SYSTEM BAG RETRIEVAL 10MM (BASKET) ×2 IMPLANT
TRAP FLUID SMOKE EVACUATOR (MISCELLANEOUS) ×2 IMPLANT
TRAY FOLEY MTR SLVR 16FR STAT (SET/KITS/TRAYS/PACK) ×2 IMPLANT
WATER STERILE IRR 500ML POUR (IV SOLUTION) ×2 IMPLANT

## 2024-08-13 NOTE — ED Notes (Signed)
 Pt walked in lobby to bathroom, back in wheelchair. Pt is moaning.

## 2024-08-13 NOTE — Transfer of Care (Signed)
 Immediate Anesthesia Transfer of Care Note  Patient: David Sherman  Procedure(s) Performed: LAPAROSCOPY, DIAGNOSTIC, ROBOT-ASSISTED JEJUNAL SEROSAL PATCH OF GASTRIC ULCER PERFORATION (Abdomen)  Patient Location: PACU  Anesthesia Type:General  Level of Consciousness: awake, alert , and oriented  Airway & Oxygen Therapy: Patient Spontanous Breathing and Patient connected to face mask oxygen  Post-op Assessment: Report given to RN and Post -op Vital signs reviewed and stable  Post vital signs: Reviewed and stable  Last Vitals:  Vitals Value Taken Time  BP 117/72 08/13/24 18:16  Temp 36.6 C 08/13/24 18:16  Pulse 84 08/13/24 18:21  Resp 16 08/13/24 18:21  SpO2 100 % 08/13/24 18:21  Vitals shown include unfiled device data.  Last Pain:  Vitals:   08/13/24 1816  TempSrc:   PainSc: Asleep         Complications: No notable events documented.

## 2024-08-13 NOTE — ED Provider Notes (Signed)
 [facility] Provider Note    Event Date/Time   First MD Initiated Contact with Patient 08/13/24 1202     (approximate)   History   Abdominal Pain   HPI  David Sherman is a 58 y.o. male history of COPD, chronic back pain, hypertension, EtOH abuse presents emergency department with right upper quadrant pain started this morning.  Stools for 2 days.  States he drinks 5 cans of wine per day and last drink was 3 days ago.  Complaining of severe right upper quadrant abdominal pain.  States he has been shaking.  No vomiting but does have nausea.  Some diarrhea for the last couple days.  Was given fentanyl 75 mcg and Zofran  4 mg IV via EMS      Physical Exam   Triage Vital Signs: ED Triage Vitals  Encounter Vitals Group     BP 08/13/24 1122 126/80     Girls Systolic BP Percentile --      Girls Diastolic BP Percentile --      Boys Systolic BP Percentile --      Boys Diastolic BP Percentile --      Pulse Rate 08/13/24 1122 (!) 54     Resp 08/13/24 1122 20     Temp 08/13/24 1122 97.7 F (36.5 C)     Temp Source 08/13/24 1122 Oral     SpO2 08/13/24 1122 97 %     Weight 08/13/24 1124 135 lb (61.2 kg)     Height 08/13/24 1124 6' (1.829 m)     Head Circumference --      Peak Flow --      Pain Score 08/13/24 1123 10     Pain Loc --      Pain Education --      Exclude from Growth Chart --     Most recent vital signs: Vitals:   08/13/24 1122  BP: 126/80  Pulse: (!) 54  Resp: 20  Temp: 97.7 F (36.5 C)  SpO2: 97%     General: Awake, no distress.   CV:  Good peripheral perfusion. regular rate and  rhythm Resp:  Normal effort. Lungs cta Abd:  No distention.  Abdomen is hard, tender Other:      ED Results / Procedures / Treatments   Labs (all labs ordered are listed, but only abnormal results are displayed) Labs Reviewed  COMPREHENSIVE METABOLIC PANEL WITH GFR - Abnormal; Notable for the following components:      Result Value   BUN 25  (*)    Calcium 8.5 (*)    All other components within normal limits  CBC - Abnormal; Notable for the following components:   RBC 4.13 (*)    MCV 101.2 (*)    MCH 34.4 (*)    All other components within normal limits  PROTIME-INR - Abnormal; Notable for the following components:   Prothrombin Time 18.7 (*)    INR 1.5 (*)    All other components within normal limits  LIPASE, BLOOD  ETHANOL  URINALYSIS, ROUTINE W REFLEX MICROSCOPIC     EKG     RADIOLOGY CT abdomen pelvis IV contrast    PROCEDURES:   Procedures  Critical Care:   Chief Complaint  Patient presents with   Abdominal Pain      MEDICATIONS ORDERED IN ED: Medications  fluconazole (DIFLUCAN) IVPB 400 mg (has no administration in time range)  sodium chloride  0.9 % bolus 1,000 mL (1,000 mLs Intravenous New Bag/Given  08/13/24 1253)  morphine (PF) 4 MG/ML injection 4 mg (4 mg Intravenous Given 08/13/24 1252)  LORazepam  (ATIVAN ) injection 1 mg (1 mg Intravenous Given 08/13/24 1252)  iohexol (OMNIPAQUE) 300 MG/ML solution 100 mL (100 mLs Intravenous Contrast Given 08/13/24 1307)  piperacillin-tazobactam (ZOSYN) IVPB 3.375 g (3.375 g Intravenous Started During Downtime 08/13/24 1358)  HYDROmorphone  (DILAUDID ) injection 1 mg (1 mg Intravenous Given 08/13/24 1357)     IMPRESSION / MDM / ASSESSMENT AND PLAN / ED COURSE  I reviewed the triage vital signs and the nursing notes.                              Differential diagnosis includes, but is not limited to, GI bleed, pancreatitis, cirrhosis, hepatitis, bowel obstruction, acute cholecystitis, acute appendicitis, pyelonephritis, alcohol withdrawal  Patient's presentation is most consistent with acute presentation with potential threat to life or bodily function.    Medications given: Zosyn 3.375 g IV, Dilaudid  1 mg IV, saline 1 L IV, morphine 4 mg IV, Ativan  1 mg IV Patient was given Zofran  via EMS prior to arrival to the ED  Labs are basically reassuring  CT  abdomen pelvis, spoke with radiologist, states patient has a pneumoperitoneum, most likely perforated ulcer  I did explain this to the patient.  Consult to surgery  Spoke with Dr. Marinda, will be taken the patient to the OR, have medicine admit  Consult hospitalist, spoke with Dr. Sherre, patient has already been sent to the OR, she will discuss with surgeon.      FINAL CLINICAL IMPRESSION(S) / ED DIAGNOSES   Final diagnoses:  Pneumoperitoneum  Generalized abdominal pain  ETOH abuse     Rx / DC Orders   ED Discharge Orders     None        Note:  This document was prepared using Dragon voice recognition software and may include unintentional dictation errors.    Gasper Devere ORN, PA-C 08/13/24 1505    Willo Dunnings, MD 08/13/24 [phone]

## 2024-08-13 NOTE — Anesthesia Preprocedure Evaluation (Signed)
 Anesthesia Evaluation  Patient identified by MRN, date of birth, ID band Patient awake    Reviewed: Allergy & Precautions, H&P , NPO status , Patient's Chart, lab work & pertinent test results, reviewed documented beta blocker date and time   Airway Mallampati: II  TM Distance: >3 FB Neck ROM: full    Dental  (+) Teeth Intact   Pulmonary COPD, Current Smoker   Pulmonary exam normal        Cardiovascular Exercise Tolerance: Poor hypertension, On Medications negative cardio ROS Normal cardiovascular exam Rhythm:regular Rate:Normal     Neuro/Psych negative neurological ROS  negative psych ROS   GI/Hepatic negative GI ROS, Neg liver ROS,,,  Endo/Other  negative endocrine ROS    Renal/GU negative Renal ROS  negative genitourinary   Musculoskeletal   Abdominal   Peds  Hematology negative hematology ROS (+)   Anesthesia Other Findings Past Medical History: No date: Chronic back pain No date: COPD (chronic obstructive pulmonary disease) (HCC) No date: Hernia of abdominal wall No date: Hypertension Past Surgical History: No date: HERNIA REPAIR BMI    Body Mass Index: 18.31 kg/m     Reproductive/Obstetrics negative OB ROS                              Anesthesia Physical Anesthesia Plan  ASA: 3 and emergent  Anesthesia Plan: General ETT   Post-op Pain Management:    Induction:   PONV Risk Score and Plan: 2  Airway Management Planned:   Additional Equipment:   Intra-op Plan:   Post-operative Plan:   Informed Consent: I have reviewed the patients History and Physical, chart, labs and discussed the procedure including the risks, benefits and alternatives for the proposed anesthesia with the patient or authorized representative who has indicated his/her understanding and acceptance.     Dental Advisory Given  Plan Discussed with: CRNA  Anesthesia Plan Comments:          Anesthesia Quick Evaluation

## 2024-08-13 NOTE — ED Notes (Signed)
 See triage note  Presents with abd pain  States pain woke him up this am   States the pain started this am  Woke him up  States he has taken Prilosec in past   but  not helping

## 2024-08-13 NOTE — Anesthesia Procedure Notes (Signed)
 Procedure Name: Intubation Date/Time: 08/13/2024 3:40 PM  Performed by: Jaylene Nest, CRNAPre-anesthesia Checklist: Patient identified, Patient being monitored, Timeout performed, Emergency Drugs available and Suction available Patient Re-evaluated:Patient Re-evaluated prior to induction Oxygen Delivery Method: Circle system utilized Preoxygenation: Pre-oxygenation with 100% oxygen Induction Type: IV induction Ventilation: Mask ventilation without difficulty Laryngoscope Size: McGrath and 4 Grade View: Grade I Tube type: Oral Tube size: 7.5 mm Number of attempts: 1 Airway Equipment and Method: Stylet and Video-laryngoscopy Placement Confirmation: ETT inserted through vocal cords under direct vision, positive ETCO2 and breath sounds checked- equal and bilateral Secured at: 23 cm Tube secured with: Tape Dental Injury: Teeth and Oropharynx as per pre-operative assessment

## 2024-08-13 NOTE — ED Triage Notes (Signed)
 To ED [facility] for for RUQ pain since this AM and dark stool since 2 days ago. Pt is daily drinker, a few wines a day last drink 3 days ago.  EMS VS: HR 60s, 12 lead normal, 99% RA, 146/83, 18# R forearm  Pt received 75mcg fentanyl and 4mg  zofran  en route

## 2024-08-13 NOTE — Assessment & Plan Note (Signed)
 Secondary to possible perforated distal gastric ulcer General Surgery is aware and has taken patient emergently to the OR N.p.o. Pain control as needed Zosyn per pharmacy Patient may need to be admitted to stepdown/ICU postprocedure per primary

## 2024-08-13 NOTE — ED Notes (Signed)
 Brother   Renay Pouch [phone]

## 2024-08-13 NOTE — Consult Note (Signed)
 Initial Consultation Note   Patient: David Sherman FMW:[id number] DOB: Dec 07, 1965 PCP: Delfina Pao, MD DOA: 08/13/2024 DOS: the patient was seen and examined on 08/13/2024 Primary service: No att. providers found  Referring physician: Gasper Pulling  Reason for consult: Acute abdomen  HPI: David Sherman is a 58 y.o. male with history of hypertension, GERD, depression, anxiety, restless leg syndrome, chronic alcohol use with likely dependence, presents to the emergency department for chief concerns of abdominal pain.  Vitals in the ED showed temperature of 97.7, respiration rate 20, heart rate 54, blood pressure 126/80, SpO2 of 97% on room air.  Serum sodium is 140, potassium 4.0, chloride 104, bicarb 26, BUN of 25, serum creatinine 0.80, eGFR greater than 60, nonfasting blood glucose 93, WBC 7.4, hemoglobin 14.2, platelets of 306.  CT abdomen pelvis with contrast: Was read as pneumoperitoneum in the epigastric region, likely related to perforated distal gastric ulcer.  ED treatment: Dilaudid  1 mg IV one-time dose, Ativan  1 mg IV one-time dose, morphine 4 mg IV one-time dose, Zosyn per pharmacy one-time dose.  EDP discussed case with general surgery, Dr. Marinda who saw the patient at bedside.  Patient was taken emergently to the OR for acute abdomen in setting of the above.  [facility] hospitalist will be consulting service in setting of acute abdomen, with patient requiring emergent OR/surgical intervention.  Assessment and Plan:  Alcohol abuse Patient's last drink was 3 days ago Patient is currently within withdrawal window CIWA precaution initiated  Pneumoperitoneum Secondary to possible perforated distal gastric ulcer General Surgery is aware and has taken patient emergently to the OR N.p.o. Pain control as needed Zosyn per pharmacy Patient may need to be admitted to stepdown/ICU postprocedure per primary  COPD (chronic obstructive pulmonary disease) (HCC) Not in  acute exacerbation DuoNebs every 6 hours.  For shortness of breath and wheezing, 3 days ordered  Essential hypertension Home lisinopril 10 mg daily can be resumed for 08/14/2024 Currently hydralazine 5 mg IV every 6 hours.  For SBP greater 175, 5 days ordered  [facility] will continue to follow the patient.  Review of Systems: As mentioned in the history of present illness. All other systems reviewed and are negative. Past Medical History:  Diagnosis Date   Chronic back pain    COPD (chronic obstructive pulmonary disease) (HCC)    Hernia of abdominal wall    Hypertension    Past Surgical History:  Procedure Laterality Date   HERNIA REPAIR     Social History:  reports that he has been smoking cigarettes. He has never used smokeless tobacco. He reports current alcohol use. He reports current drug use. Drug: Crack cocaine.  Allergies  Allergen Reactions   Hydrocodone-Acetaminophen  Nausea And Vomiting   Tramadol-Acetaminophen  Nausea And Vomiting   Ibuprofen      Pt avoids ibuprofen  GI upset   History reviewed. No pertinent family history.  Prior to Admission medications   Medication Sig Start Date End Date Taking? Authorizing Provider  albuterol  (VENTOLIN  HFA) 108 (90 Base) MCG/ACT inhaler Inhale 2 puffs into the lungs every 4 (four) hours as needed for wheezing. Patient not taking: Reported on 08/13/2024    [provider]  budesonide-formoterol  (SYMBICORT) 160-4.5 MCG/ACT inhaler Inhale 2 puffs into the lungs 2 (two) times a day. 11/15/18 11/15/19  [provider]  citalopram  (CELEXA ) 20 MG tablet Take 20 mg by mouth daily. Patient not taking: Reported on 08/13/2024 01/03/19   [provider]  lisinopril (ZESTRIL) 10 MG tablet Take 10 mg  by mouth daily. Patient not taking: Reported on 08/13/2024    [provider]  meclizine  (ANTIVERT ) 12.5 MG tablet Take 1 tablet (12.5 mg total) by mouth 3 (three) times daily as needed for dizziness. Patient not taking:  Reported on 08/13/2024 03/12/19   Sherial Bail, MD  omeprazole (PRILOSEC) 40 MG capsule Take 40 mg by mouth 2 (two) times a day.  Patient not taking: Reported on 08/13/2024    [provider]  rOPINIRole (REQUIP) 0.5 MG tablet Take 0.5 mg by mouth 3 (three) times daily. Patient not taking: Reported on 08/13/2024 02/02/19   [provider]  tiZANidine (ZANAFLEX) 4 MG tablet Take 2-4 mg by mouth 3 (three) times daily as needed for muscle spasms. Patient not taking: Reported on 08/13/2024    [provider]   Physical Exam: Vitals:   08/13/24 1122 08/13/24 1124 08/13/24 1522  BP: 126/80  134/67  Pulse: (!) 54  80  Resp: 20  (!) 22  Temp: 97.7 F (36.5 C)  (!) 100.9 F (38.3 C)  TempSrc: Oral    SpO2: 97%  94%  Weight:  61.2 kg   Height:  6' (1.829 m)    Physical Exam Constitutional:      Appearance: He is ill-appearing and toxic-appearing.  HENT:     Head: Normocephalic and atraumatic.  Eyes:     Extraocular Movements: Extraocular movements intact.     Pupils: Pupils are equal, round, and reactive to light.  Cardiovascular:     Rate and Rhythm: Regular rhythm. Bradycardia present.     Heart sounds: No murmur heard.    No friction rub. No gallop.  Pulmonary:     Effort: Pulmonary effort is normal.     Breath sounds: Normal breath sounds.  Abdominal:     General: Abdomen is flat and scaphoid. Bowel sounds are decreased.     Palpations: Abdomen is rigid.     Tenderness: There is generalized abdominal tenderness.  Skin:    General: Skin is warm and dry.  Neurological:     General: No focal deficit present.     Mental Status: He is alert and oriented to person, place, and time.  Psychiatric:        Mood and Affect: Mood is anxious.        Behavior: Behavior normal.   Data Reviewed: per above Family Communication: Brother, Clent Damore, has been updated Prognosis: very guarded Primary team communication: Communicated with primary team Thank  you very much for involving us  in the care of your patient.  Author: Dr. Sherre 08/13/2024 3:45 PM  For on call review [email or web address].

## 2024-08-13 NOTE — Assessment & Plan Note (Signed)
 Not in acute exacerbation DuoNebs every 6 hours.  For shortness of breath and wheezing, 3 days ordered

## 2024-08-13 NOTE — Assessment & Plan Note (Signed)
 Patient's last drink was 3 days ago Patient is currently within withdrawal window CIWA precaution initiated

## 2024-08-13 NOTE — Op Note (Signed)
 Operative note  Preoperative diagnosis:.  Pneumoperitoneum Postoperative diagnosis: Perforated prepyloric gastric ulcer Surgeon: Jayson Hobby, MD EBL: 10 cc Case type: Contaminated Procedure: Diagnostic laparoscopy, robotic jejunal serosal patch repair of perforated gastric ulcer  After informed consent was obtained the patient was brought to the operating room and placed supine on the operating room table.  General endotracheal anesthesia was then induced and his abdomen was then prepped and draped in the usual sterile fashion.  A surgical timeout was called identifying correct patient, site, side and procedure.  Incision was made in the left upper quadrant at Palmer's point and a Veress needle was inserted into the abdomen using standard drop technique.  Pneumoperitoneum was then established.  A supraumbilical vertical incision was made and an 8 mm Optiview trocar was placed into the abdomen under direct visualization.  Upon survey of the abdomen there is noted to be no injury at the site of Veress needle insertion.  The small bowel, transverse colon and stomach were inflamed and the whole peritoneum had inflammatory and hemorrhagic changes to it.  On the anterior side of the stomach there appeared to be a 1 cm hole in the prepyloric region.  3 additional 8 mm robotic trocars were placed under direct visualization.  2 were placed in the right hemiabdomen and the third 1 was placed in the left hemiabdomen.  The edges of the hole were debrided.  The patient did not have any omentum that was able to be brought up as it had been foreshortened secondary to inflammation.  Due to this I decided to perform a jejunal serosal patch.  A loop of jejunum was selected and brought up easily to the perforation.  To ensure that this was the perforation the hole was submerged under water and it air was inserted into the NG tube and there was bubbling.  Once we confirmed that this was the site of perforation a Dobbhoff tube  was placed at 80 cm at the nare.  With insufflation of air the NG tube was noted to be in the stomach.  The jejunum was brought up to the area of perforation.  The serosa of the stomach was then secured to the serosa of the jejunum creating a jejunal serosal patch.  This was done over the entirety of the perforation.  Once this was completed the patient was placed with head down and the upper abdomen was submerged under water.  Again there was insufflation of the stomach via the NG tube and this time there was noted to be no bubbling of the irrigation.  The pelvis was then examined and there was fibrinous exudate in the small bowel and murky fluid in the pelvis.  This was suctioned out.  A 19 French Blake drain was then placed over the site of the repair.  The abdomen was then allowed to be desufflated and the trocars were removed.  The drain was secured with a 3-0 nylon stitch.  The fascia of the other port sites was closed with a 0 Vicryl on a UR 6 suture.  The skin was then closed with 4-0 Monocryl and dressed with glue.  The patient was taken to the PACU in stable condition.  Prior to termination of the procedure all sponge and instrument counts were correct x 2.

## 2024-08-13 NOTE — Hospital Course (Signed)
 Mr. David Sherman is a 58 year old male with history of hypertension, GERD, depression, anxiety, restless leg syndrome, chronic alcohol use with likely dependence, presents to the emergency department for chief concerns of abdominal pain.  Vitals in the ED showed temperature of 97.7, respiration rate 20, heart rate 54, blood pressure 126/80, SpO2 of 97% on room air.  Serum sodium is 140, potassium 4.0, chloride 104, bicarb 26, BUN of 25, serum creatinine 0.80, eGFR greater than 60, nonfasting blood glucose 93, WBC 7.4, hemoglobin 14.2, platelets of 306.  CT abdomen pelvis with contrast: Was read as pneumoperitoneum in the epigastric region, likely related to perforated distal gastric ulcer.  ED treatment: Dilaudid  1 mg IV one-time dose, Ativan  1 mg IV one-time dose, morphine 4 mg IV one-time dose, Zosyn per pharmacy one-time dose.  EDP discussed case with general surgery, Dr. Marinda who saw the patient at bedside.  Patient was taken emergently to the OR for acute abdomen in setting of the above.  [facility] hospitalist will be consulting service in setting of acute abdomen, with patient requiring emergent OR/surgical intervention.

## 2024-08-13 NOTE — Assessment & Plan Note (Signed)
 Home lisinopril 10 mg daily can be resumed for 08/14/2024 Currently hydralazine 5 mg IV every 6 hours.  For SBP greater 175, 5 days ordered

## 2024-08-13 NOTE — Consult Note (Signed)
 Patient ID: David Sherman, male   DOB: 03-04-1966, 58 y.o.   MRN: [id number] CC: Pneumoperitoneum  History of Present Illness David Sherman is a 58 y.o. male with PMH of alcohol abuse, COPD, HTN who presents in consultation for penumoperitoneum. The patient reports that this morning he was awoken up from sleep due to abdominal pain. He describes the pain as throughout his abdomen. This was associated with nausea but denies any episodes of emesis. He denies any previous episodes similar to this.  He tried pantoprazole  to relieve the pain but it did not help.  He does have a history of smoking about a pack to a pack and 1/2/day.  He also reports drinking about 5-6 wine coolers per day.  He intermittently will take NSAIDs for his back pain.  He came to the ED and had labs that showed a normal white count.  He had a CT scan that showed free air in the upper abdomen concerning for perforated gastric ulcer.  Past Medical History Past Medical History:  Diagnosis Date   Chronic back pain    COPD (chronic obstructive pulmonary disease) (HCC)    Hernia of abdominal wall    Hypertension        Past Surgical History:  Procedure Laterality Date   HERNIA REPAIR      No Known Allergies  Current Facility-Administered Medications  Medication Dose Route Frequency Provider Last Rate Last Admin   fluconazole (DIFLUCAN) IVPB 400 mg  400 mg Intravenous Q24H Marinda Jayson KIDD, MD       Current Outpatient Medications  Medication Sig Dispense Refill   albuterol  (VENTOLIN  HFA) 108 (90 Base) MCG/ACT inhaler Inhale 2 puffs into the lungs every 4 (four) hours as needed for wheezing.     budesonide-formoterol  (SYMBICORT) 160-4.5 MCG/ACT inhaler Inhale 2 puffs into the lungs 2 (two) times a day.     citalopram  (CELEXA ) 20 MG tablet Take 20 mg by mouth daily.     lisinopril (ZESTRIL) 10 MG tablet Take 10 mg by mouth daily.     meclizine  (ANTIVERT ) 12.5 MG tablet Take 1 tablet (12.5 mg total) by mouth 3  (three) times daily as needed for dizziness. 30 tablet 0   omeprazole (PRILOSEC) 40 MG capsule Take 40 mg by mouth 2 (two) times a day.      rOPINIRole (REQUIP) 0.5 MG tablet Take 0.5 mg by mouth 3 (three) times daily.     tiZANidine (ZANAFLEX) 4 MG tablet Take 2-4 mg by mouth 3 (three) times daily as needed for muscle spasms.      Family History History reviewed. No pertinent family history.     Social History Social History   Tobacco Use   Smoking status: Every Day    Current packs/day: 0.50    Types: Cigarettes   Smokeless tobacco: Never  Vaping Use   Vaping status: Never Used  Substance Use Topics   Alcohol use: Yes    Comment: daily drinker a few wines a day   Drug use: No        ROS Full ROS of systems performed and is otherwise negative there than what is stated in the HPI  Physical Exam Blood pressure 126/80, pulse (!) 54, temperature 97.7 F (36.5 C), temperature source Oral, resp. rate 20, height 6' (1.829 m), weight 61.2 kg, SpO2 97%.  Appears ill, normal work of breathing room air, sinus bradycardia, abdomen is rigid and mottled, previous surgical scars are healed well, fusilli tender to palpation with  guarding consistent with peritonitis  Data Reviewed CT scan reviewed.  He has free air in the upper abdomen.  Some inflammation around the distal stomach.  I do not see any inflammation around the sigmoid colon.  I have personally reviewed the patient's imaging and medical records.    Assessment    Patient with peritonitis and pneumoperitoneum  Plan    Discussed with patient that given his peritonitis and free air we will need to proceed to the operating room.  We will perform a diagnostic laparoscopy and possible robotic repair of peptic ulcer possible open.  I discussed the risk, benefits alternatives of the procedure including risk of leak, damage to the small bowel or colon.  I also discussed the need for prolonged parenteral nutrition or Dobbhoff tube  placement.  We will start him on a dose of fluconazole.  Continue on Zosyn.  Given his multiple medical problems he will need to be admitted to the hospitalist service postoperatively.    Jayson MALVA Endow 08/13/2024, 2:29 PM

## 2024-08-14 ENCOUNTER — Encounter: Payer: Self-pay | Admitting: General Surgery

## 2024-08-14 ENCOUNTER — Other Ambulatory Visit: Payer: Self-pay

## 2024-08-14 ENCOUNTER — Inpatient Hospital Stay

## 2024-08-14 DIAGNOSIS — K251 Acute gastric ulcer with perforation: Secondary | ICD-10-CM

## 2024-08-14 DIAGNOSIS — E43 Unspecified severe protein-calorie malnutrition: Secondary | ICD-10-CM | POA: Insufficient documentation

## 2024-08-14 LAB — CBC
HCT: 36.2 % — ABNORMAL LOW (ref 39.0–52.0)
Hemoglobin: 12.3 g/dL — ABNORMAL LOW (ref 13.0–17.0)
MCH: 34.3 pg — ABNORMAL HIGH (ref 26.0–34.0)
MCHC: 34 g/dL (ref 30.0–36.0)
MCV: 100.8 fL — ABNORMAL HIGH (ref 80.0–100.0)
Platelets: 196 K/uL (ref 150–400)
RBC: 3.59 MIL/uL — ABNORMAL LOW (ref 4.22–5.81)
RDW: 12 % (ref 11.5–15.5)
WBC: 16.7 K/uL — ABNORMAL HIGH (ref 4.0–10.5)
nRBC: 0 % (ref 0.0–0.2)

## 2024-08-14 LAB — MAGNESIUM: Magnesium: 1.8 mg/dL (ref 1.7–2.4)

## 2024-08-14 LAB — MRSA NEXT GEN BY PCR, NASAL: MRSA by PCR Next Gen: DETECTED — AB

## 2024-08-14 LAB — BASIC METABOLIC PANEL WITH GFR
Anion gap: 9 (ref 5–15)
BUN: 23 mg/dL — ABNORMAL HIGH (ref 6–20)
CO2: 22 mmol/L (ref 22–32)
Calcium: 7.8 mg/dL — ABNORMAL LOW (ref 8.9–10.3)
Chloride: 107 mmol/L (ref 98–111)
Creatinine, Ser: 0.9 mg/dL (ref 0.61–1.24)
GFR, Estimated: >60 mL/min (ref >60)
Glucose, Bld: 108 mg/dL — ABNORMAL HIGH (ref 70–99)
Potassium: 4.4 mmol/L (ref 3.5–5.1)
Sodium: 138 mmol/L (ref 135–145)

## 2024-08-14 LAB — PHOSPHORUS: Phosphorus: 3.5 mg/dL (ref 2.5–4.6)

## 2024-08-14 LAB — HIV ANTIBODY (ROUTINE TESTING W REFLEX): HIV Screen 4th Generation wRfx: NONREACTIVE

## 2024-08-14 MED ORDER — SODIUM CHLORIDE 0.9% FLUSH: 10.0000 mL | INTRAVENOUS | Status: DC | PRN

## 2024-08-14 MED ORDER — ORAL CARE MOUTH RINSE: 15.0000 mL | OROMUCOSAL | Status: DC | PRN

## 2024-08-14 MED ORDER — MUPIROCIN 2 % EX OINT
TOPICAL_OINTMENT | Freq: Two times a day (BID) | CUTANEOUS | Status: DC
  Filled 2024-08-14: qty 22

## 2024-08-14 MED ORDER — THIAMINE HCL 100 MG/ML IJ SOLN
100.0000 mg | Freq: Every day | INTRAMUSCULAR | Status: DC
  Administered 2024-08-14 – 2024-08-21 (×8): 100 mg via INTRAVENOUS
  Filled 2024-08-14 (×8): qty 2

## 2024-08-14 MED ORDER — MAGNESIUM SULFATE 2 GM/50ML IV SOLN
2.0000 g | Freq: Once | INTRAVENOUS | Status: AC
  Administered 2024-08-14: 2 g via INTRAVENOUS
  Filled 2024-08-14 (×2): qty 50

## 2024-08-14 MED ORDER — INSULIN ASPART 100 UNIT/ML IJ SOLN
0.0000 [IU] | Freq: Four times a day (QID) | INTRAMUSCULAR | Status: DC
  Administered 2024-08-16 – 2024-08-18 (×8): 1 [IU] via SUBCUTANEOUS
  Administered 2024-08-18: 2 [IU] via SUBCUTANEOUS
  Administered 2024-08-19 (×2): 1 [IU] via SUBCUTANEOUS
  Administered 2024-08-21: 2 [IU] via SUBCUTANEOUS
  Filled 2024-08-14 (×11): qty 1

## 2024-08-14 MED ORDER — SODIUM CHLORIDE 0.9 % IV SOLN
12.5000 mg | Freq: Four times a day (QID) | INTRAVENOUS | Status: DC | PRN
  Administered 2024-08-14: 12.5 mg via INTRAVENOUS
  Filled 2024-08-14 (×2): qty 0.5

## 2024-08-14 MED ORDER — TRACE MINERALS CU-MN-SE-ZN 300-55-60-3000 MCG/ML IV SOLN
INTRAVENOUS | Status: AC
  Filled 2024-08-14: qty 244

## 2024-08-14 MED ORDER — SODIUM CHLORIDE 0.9% FLUSH
10.0000 mL | Freq: Two times a day (BID) | INTRAVENOUS | Status: DC
  Administered 2024-08-14 – 2024-08-15 (×2): 10 mL
  Administered 2024-08-15: 20 mL
  Administered 2024-08-16 – 2024-08-20 (×9): 10 mL
  Administered 2024-08-20: 20 mL
  Administered 2024-08-21: 10 mL

## 2024-08-14 MED ORDER — PNEUMOCOCCAL 20-VAL CONJ VACC 0.5 ML IM SUSY
0.5000 mL | PREFILLED_SYRINGE | INTRAMUSCULAR | Status: AC
  Administered 2024-08-15: 0.5 mL via INTRAMUSCULAR
  Filled 2024-08-14: qty 0.5

## 2024-08-14 MED ORDER — FOLIC ACID 5 MG/ML IJ SOLN
1.0000 mg | Freq: Every day | INTRAMUSCULAR | Status: DC
  Administered 2024-08-14: 1 mg via INTRAVENOUS
  Filled 2024-08-14 (×3): qty 0.2

## 2024-08-14 MED ORDER — CHLORHEXIDINE GLUCONATE CLOTH 2 % EX PADS
6.0000 | MEDICATED_PAD | Freq: Every day | CUTANEOUS | Status: DC
  Administered 2024-08-14 – 2024-08-20 (×7): 6 via TOPICAL

## 2024-08-14 MED ORDER — INFLUENZA VIRUS VACC SPLIT PF (FLUZONE) 0.5 ML IM SUSY
0.5000 mL | PREFILLED_SYRINGE | INTRAMUSCULAR | Status: AC
  Administered 2024-08-15: 0.5 mL via INTRAMUSCULAR
  Filled 2024-08-14: qty 0.5

## 2024-08-14 NOTE — Progress Notes (Cosign Needed Addendum)
 [facility] SURGICAL PROGRESS NOTE  Hospital Day(s): 1.   Post op day(s): 1 Day Post-Op.   Interval History:  Patient seen and examined No acute events or new complaints overnight.  Patient reports his abdominal pain is improved No fever, chills, emesis Leukocytosis to 16.7K; likely reactive  Hgb to 12.3 Renal function normal; sCr - 0.90; UO - 575 ccs No electrolyte derangements Surgical drain with 110 ccs out; serous NGT in place; output unmeasured He is on Zosyn   Vital signs in last 24 hours: [min-max] current  Temp:  [97.7 F (36.5 C)-100.9 F (38.3 C)] 98.7 F (37.1 C) (11/03 0800) Pulse Rate:  [52-93] 59 (11/03 0900) Resp:  [12-26] 20 (11/03 0900) BP: (97-137)/(51-89) 115/69 (11/03 0900) SpO2:  [87 %-99 %] 99 % (11/03 0900) Weight:  [61.2 kg] 61.2 kg (11/02 1124)     Height: 6' (182.9 cm) Weight: 61.2 kg BMI (Calculated): 18.31   Intake/Output last 2 shifts:  11/02 0701 - 11/03 0700 In: 3168.2 [I.V.:2896.2; IV Piggyback:272] Out: 735 [Urine:575; Drains:110]   Physical Exam:  Constitutional: alert, cooperative and no distress  HEENT: NGT in place; output bilious  Respiratory: breathing non-labored at rest  Cardiovascular: regular rate and sinus rhythm  Gastrointestinal: soft, incisional soreness, and non-distended, no rebound/guarding. Surgical drain in place; output serous Integumentary: Laparoscopic incisions are CDI with dermabond   Labs:     Latest Ref Rng & Units 08/14/2024    5:28 AM 08/13/2024   11:25 AM 03/12/2019    8:05 AM  CBC  WBC 4.0 - 10.5 K/uL 16.7  7.4  13.4   Hemoglobin 13.0 - 17.0 g/dL 87.6  85.7  87.4   Hematocrit 39.0 - 52.0 % 36.2  41.8  34.8   Platelets 150 - 400 K/uL 196  306  151       Latest Ref Rng & Units 08/14/2024    5:28 AM 08/13/2024   11:25 AM 03/12/2019    5:01 AM  CMP  Glucose 70 - 99 mg/dL 891  93  894   BUN 6 - 20 mg/dL 23  25  6    Creatinine 0.61 - 1.24 mg/dL 9.09  9.19  9.28   Sodium 135 - 145  mmol/L 138  140  138   Potassium 3.5 - 5.1 mmol/L 4.4  4.0  3.1   Chloride 98 - 111 mmol/L 107  104  107   CO2 22 - 32 mmol/L 22  26  23    Calcium 8.9 - 10.3 mg/dL 7.8  8.5  7.6   Total Protein 6.5 - 8.1 g/dL  6.7    Total Bilirubin 0.0 - 1.2 mg/dL  1.0    Alkaline Phos 38 - 126 U/L  67    AST 15 - 41 U/L  23    ALT 0 - 44 U/L  15       Imaging studies: No new pertinent imaging studies   Assessment/Plan:  58 y.o. male 1 Day Post-Op s/p robotic assisted diagnostic laparoscopy and jejunal serosal patch repair of perforated gastric ulcer    - He will need to be STRICT NPO until 11/06. We will plan for UGI on this date to reassess repair.   - Consult for PICC/TPN today   - Continue NGT decompression; LIS; monitor and record output   - Continue IV Abx (Zosyn)  - Monitor abdominal examination - Monitor leukocytosis; likely reactive in nature  - Pain control prn; avoid NSAID - Antiemetics prn - Okay to  mobilize; engage therapies if needed; can clamp NGT for mobilization - Further management per primary service; we will follow    All of the above findings and recommendations were discussed with the patient, and the medical team, and all of patient's questions were answered to his expressed satisfaction.  -- Arthea Platt, PA-C [facility] 08/14/2024, 9:56 AM M-F: 7am - 4pm\

## 2024-08-14 NOTE — Plan of Care (Signed)
  Problem: Clinical Measurements: Goal: Ability to maintain clinical measurements within normal limits will improve Outcome: Progressing Goal: Will remain free from infection Outcome: Progressing Goal: Diagnostic test results will improve Outcome: Progressing Goal: Respiratory complications will improve Outcome: Progressing Goal: Cardiovascular complication will be avoided Outcome: Progressing   Problem: Activity: Goal: Risk for activity intolerance will decrease Outcome: Progressing   Problem: Elimination: Goal: Will not experience complications related to urinary retention Outcome: Progressing   Problem: Pain Managment: Goal: General experience of comfort will improve and/or be controlled Outcome: Progressing   Problem: Safety: Goal: Ability to remain free from injury will improve Outcome: Progressing   Problem: Skin Integrity: Goal: Risk for impaired skin integrity will decrease Outcome: Progressing

## 2024-08-14 NOTE — Anesthesia Postprocedure Evaluation (Signed)
 Anesthesia Post Note  Patient: David Sherman  Procedure(s) Performed: LAPAROSCOPY, DIAGNOSTIC, ROBOT-ASSISTED JEJUNAL SEROSAL PATCH OF GASTRIC ULCER PERFORATION (Abdomen)  Patient location during evaluation: ICU Anesthesia Type: General Level of consciousness: awake, awake and alert and oriented Pain management: pain level controlled Vital Signs Assessment: post-procedure vital signs reviewed and stable Respiratory status: spontaneous breathing, nonlabored ventilation and respiratory function stable Cardiovascular status: blood pressure returned to baseline and stable Postop Assessment: no backache Anesthetic complications: no   No notable events documented.   Last Vitals:  Vitals:   08/14/24 0500 08/14/24 0600  BP: (!) 99/56 115/68  Pulse: (!) 59 63  Resp: 14 20  Temp:    SpO2: 99% 98%    Last Pain:  Vitals:   08/14/24 0547  TempSrc:   PainSc: Asleep                 [name]

## 2024-08-14 NOTE — Progress Notes (Signed)
 PROGRESS NOTE    David Sherman  FMW:[id number] DOB: 1965/12/24 DOA: 08/13/2024 PCP: Delfina Pao, MD    Brief Narrative:    David Sherman is a 58 y.o. male with history of hypertension, GERD, depression, anxiety, restless leg syndrome, chronic alcohol use with likely dependence, presents to the emergency department for chief concerns of abdominal pain.   Vitals in the ED showed temperature of 97.7, respiration rate 20, heart rate 54, blood pressure 126/80, SpO2 of 97% on room air.   Serum sodium is 140, potassium 4.0, chloride 104, bicarb 26, BUN of 25, serum creatinine 0.80, eGFR greater than 60, nonfasting blood glucose 93, WBC 7.4, hemoglobin 14.2, platelets of 306.   CT abdomen pelvis with contrast: Was read as pneumoperitoneum in the epigastric region, likely related to perforated distal gastric ulcer.   Patient was taken emergently to the OR for acute abdomen in setting of the above.   Underwent jejunal patch by general surgery on 11/2.  Tolerated well.     Assessment & Plan:   Principal Problem:   Perforated gastric ulcer (HCC) Active Problems:   Pneumoperitoneum   Alcohol abuse   Essential hypertension   COPD (chronic obstructive pulmonary disease) (HCC)   GERD (gastroesophageal reflux disease)   Protein-calorie malnutrition, severe  Alcohol abuse Patient's last drink was 3 days prior to presentation.  Still within the withdrawal window however the likelihood of acute withdrawal is lower at this point. Plan: Continue CIWA protocol for now.  If patient does not exhibit any clinical signs of withdrawal within the next 24 hours can consider discontinuation of CIWA assessments   Pneumoperitoneum Secondary to possible perforated distal gastric ulcer General Surgery is aware and has taken patient emergently to the OR Patient now status post jejunal patch Plan: Strict n.p.o. Zosyn Transfer to floor TPN   COPD (chronic obstructive pulmonary disease)  (HCC) Not acutely exacerbated.  As needed nebs   Essential hypertension Home lisinopril As needed hydralazine    DVT prophylaxis: SQ Lovenox  Code Status: Full Family Communication: None Disposition Plan: Status is: Inpatient Remains inpatient appropriate because: Perforated gastric ulcer   Level of care: Med-Surg  Consultants:  Surgery  Procedures:  Ex lap  Antimicrobials: Zosyn    Subjective: Seen and examined.  Appears fatigued otherwise stable.  Objective: Vitals:   08/14/24 1130 08/14/24 1140 08/14/24 1147 08/14/24 1150  BP: 103/72     Pulse: 62 (!) 57  61  Resp: 19 14  (!) 25  Temp:   98.2 F (36.8 C)   TempSrc:   Oral   SpO2: 99% 96%  98%  Weight:      Height:        Intake/Output Summary (Last 24 hours) at 08/14/2024 1213 Last data filed at 08/14/2024 1153 Gross per 24 hour  Intake 3931.42 ml  Output 895 ml  Net 3036.42 ml   Filed Weights   08/13/24 1124  Weight: 61.2 kg    Examination:  General exam: Appears weak and deconditioned Respiratory system: Clear to auscultation. Respiratory effort normal. Cardiovascular system: S1-S2, RRR, no murmurs, no pedal edema Gastrointestinal system: Midline surgical incision, incision CDI.  Decreased bowel sounds Central nervous system: Alert and oriented. No focal neurological deficits. Extremities: Symmetric 5 x 5 power. Skin: No rashes, lesions or ulcers Psychiatry: Judgement and insight appear normal. Mood & affect appropriate.     Data Reviewed: I have personally reviewed following labs and imaging studies  CBC: Recent Labs  Lab 08/13/24 1125 08/14/24 0528  WBC 7.4 16.7*  HGB 14.2 12.3*  HCT 41.8 36.2*  MCV 101.2* 100.8*  PLT 306 196   Basic Metabolic Panel: Recent Labs  Lab 08/13/24 1125 08/14/24 0528  NA 140 138  K 4.0 4.4  CL 104 107  CO2 26 22  GLUCOSE 93 108*  BUN 25* 23*  CREATININE 0.80 0.90  CALCIUM 8.5* 7.8*  MG  --  1.8  PHOS  --  3.5   GFR: Estimated Creatinine  Clearance: 78.4 mL/min (by C-G formula based on SCr of 0.9 mg/dL). Liver Function Tests: Recent Labs  Lab 08/13/24 1125  AST 23  ALT 15  ALKPHOS 67  BILITOT 1.0  PROT 6.7  ALBUMIN 3.6   Recent Labs  Lab 08/13/24 1125  LIPASE 33   No results for input(s): AMMONIA in the last 168 hours. Coagulation Profile: Recent Labs  Lab 08/13/24 1411  INR 1.5*   Cardiac Enzymes: No results for input(s): CKTOTAL, CKMB, CKMBINDEX, TROPONINI in the last 168 hours. BNP (last 3 results) No results for input(s): PROBNP in the last 8760 hours. HbA1C: No results for input(s): HGBA1C in the last 72 hours. CBG: Recent Labs  Lab 08/13/24 1918  GLUCAP 121*   Lipid Profile: No results for input(s): CHOL, HDL, LDLCALC, TRIG, CHOLHDL, LDLDIRECT in the last 72 hours. Thyroid  Function Tests: No results for input(s): TSH, T4TOTAL, FREET4, T3FREE, THYROIDAB in the last 72 hours. Anemia Panel: No results for input(s): VITAMINB12, FOLATE, FERRITIN, TIBC, IRON, RETICCTPCT in the last 72 hours. Sepsis Labs: No results for input(s): PROCALCITON, LATICACIDVEN in the last 168 hours.  Recent Results (from the past 240 hours)  MRSA Next Gen by PCR, Nasal     Status: Abnormal   Collection Time: 08/13/24  7:53 PM   Specimen: Nasal Mucosa; Nasal Swab  Result Value Ref Range Status   MRSA by PCR Next Gen DETECTED (A) NOT DETECTED Final    Comment: RESULT CALLED TO, READ BACK BY AND VERIFIED WITH: EBONY COOPER, RN @0125  08/14/2024 COP (NOTE) The GeneXpert MRSA Assay (FDA approved for NASAL specimens only), is one component of a comprehensive MRSA colonization surveillance program. It is not intended to diagnose MRSA infection nor to guide or monitor treatment for MRSA infections. Test performance is not FDA approved in patients less than 66 years old. Performed at [facility], [address] [place], [place] [address]           Radiology Studies: US  EKG SITE RITE Result Date: 08/14/2024 If Site Rite image not attached, placement could not be confirmed due to current cardiac rhythm.  DG Abd 1 View Result Date: 08/13/2024 EXAM: 1 VIEW XRAY OF THE ABDOMEN 08/13/2024 07:16:00 PM COMPARISON: None available. CLINICAL HISTORY: [id number] Encounter for nasogastric (NG) tube placement [id number] Encounter for nasogastric (NG) tube placement FINDINGS: LINES, TUBES AND DEVICES: Enteric tube coursing below the hemidiaphragm with tip overlying the expected region of the gastric lumen and side port overlying the expected gastroesophageal. Weighted enteric tube with the tip overlying the expected region of the gastric lumen. A surgical drain overlies the abdomen. BOWEL: Nonobstructive bowel gas pattern. SOFT TISSUES: No opaque urinary calculi. BONES: No acute osseous abnormality. IMPRESSION: 1. Enteric tube coursing below the hemidiaphragm with tip overlying the expected region of the gastric lumen and side port overlying the expected gastroesophageal. 2. Weighted enteric tube with the tip overlying the expected region of the gastric lumen. 3. Surgical drain projects over the abdomen. Electronically signed by: Morgane Naveau MD 08/13/2024 07:22  PM EST RP Workstation: [id number]   CT ABDOMEN PELVIS W CONTRAST Result Date: 08/13/2024 EXAM: CT ABDOMEN AND PELVIS WITH CONTRAST 08/13/2024 01:16:25 PM TECHNIQUE: CT of the abdomen and pelvis was performed with the administration of 100 mL of iohexol (OMNIPAQUE) 300 MG/ML solution. Multiplanar reformatted images are provided for review. Automated exposure control, iterative reconstruction, and/or weight-based adjustment of the mA/kV was utilized to reduce the radiation dose to as low as reasonably achievable. COMPARISON: None available. CLINICAL HISTORY: Abdominal pain, acute, nonlocalized. FINDINGS: LOWER CHEST: No acute abnormality. LIVER: Hepatic steatosis. GALLBLADDER AND BILE DUCTS: Gallbladder  is unremarkable. No biliary ductal dilatation. SPLEEN: No acute abnormality. PANCREAS: No acute abnormality. ADRENAL GLANDS: No acute abnormality. KIDNEYS, URETERS AND BLADDER: No stones in the kidneys or ureters. No hydronephrosis. No perinephric or periureteral stranding. Urinary bladder is unremarkable. GI AND BOWEL: Pneumoperitoneum is noted in the epigastric region which appears to be most likely due to perforated distal gastric ulcer. There is no bowel obstruction. PERITONEUM AND RETROPERITONEUM: Pneumoperitoneum is noted in the epigastric region which appears to be most likely due to perforated distal gastric ulcer. No ascites. VASCULATURE: Aortic atherosclerosis. Aorta is normal in caliber. LYMPH NODES: No lymphadenopathy. REPRODUCTIVE ORGANS: No acute abnormality. BONES AND SOFT TISSUES: No acute osseous abnormality. No focal soft tissue abnormality. IMPRESSION: 1. Pneumoperitoneum in the epigastric region, likely related to a perforated distal gastric ulcer. This finding was discussed with Devere Perry, PA, at 01:30 pm on 08/13/2024. 2. Hepatic steatosis. Electronically signed by: Lynwood Seip MD 08/13/2024 01:31 PM EST RP Workstation: [id number]        Scheduled Meds:  Chlorhexidine Gluconate Cloth  6 each Topical Daily   enoxaparin  (LOVENOX ) injection  40 mg Subcutaneous Q24H   folic acid   1 mg Intravenous Daily   [START ON 08/15/2024] influenza vac split trivalent PF  0.5 mL Intramuscular Tomorrow-1000   [START ON 08/15/2024] insulin aspart  0-9 Units Subcutaneous Q6H   methocarbamol (ROBAXIN) injection  500 mg Intravenous Q8H   mupirocin ointment   Nasal BID   pantoprazole  (PROTONIX ) IV  40 mg Intravenous Q12H   [START ON 08/15/2024] pneumococcal 20-valent conjugate vaccine  0.5 mL Intramuscular Tomorrow-1000   thiamine  (VITAMIN B1) injection  100 mg Intravenous Daily   Continuous Infusions:  acetaminophen  Stopped (08/14/24 1149)   chlorproMAZINE (THORAZINE) 12.5 mg in sodium chloride   0.9 % 25 mL IVPB     fluconazole (DIFLUCAN) IV     lactated ringers Stopped (08/14/24 1151)   magnesium sulfate bolus IVPB     piperacillin-tazobactam (ZOSYN)  IV Stopped (08/14/24 0948)   TPN ADULT (ION)       LOS: 1 day      Calvin KATHEE Robson, MD [facility]   If 7PM-7AM, please contact night-coverage  08/14/2024, 12:13 PM

## 2024-08-14 NOTE — Progress Notes (Signed)
 Attempted to call report to 1A and give report. Nurse did not answer. Will try again.

## 2024-08-14 NOTE — Progress Notes (Addendum)
 PHARMACY - TOTAL PARENTERAL NUTRITION CONSULT NOTE   Indication: Prolonged ileus  Patient Measurements: Height: 6' (182.9 cm) Weight: 61.2 kg (135 lb) IBW/kg (Calculated) : 77.6 TPN AdjBW (KG): 61.2 Body mass index is 18.31 kg/m.  Assessment:  58 y.o. male 1 Day Post-Op s/p robotic assisted diagnostic laparoscopy and jejunal serosal patch repair of perforated gastric ulcer   Glucose / Insulin: not on SSI Electrolytes: WNL Renal: SCr<1, stable Hepatic: LFTs wnl Intake / Output;  11/02 0701 - 11/03 0700 In: 3168.2 [I.V.:2896.2; IV Piggyback:272] Out: 735 [Urine:575; Drains:110]  GI Imaging: No new pertinent imaging studies  GI Surgeries / Procedures:  11/02 robotic assisted diagnostic laparoscopy and jejunal serosal patch repair of perforated gastric ulcer   Central access: pending TPN start date: 08/14/24 (pending)  Nutritional Goals: Goal TPN rate is 75 mL/hr (provides 109.8 g of protein and 2200.7 kcals per day)  RD Assessment:    Current Nutrition:  NPO  Plan:  ---Start TPN at 15mL/hr at 1800 ---Electrolytes in TPN (standard): Na 50mEq/L, K 56mEq/L, Ca 66mEq/L, Mg 35mEq/L, and Phos 15mmol/L. Cl:Ac 1:1 ---2 grams IV magnesium sulfate x 1 ---Add standard MVI and trace elements to TPN ---Initiate Sensitive q6h SSI and adjust as needed  ---Monitor TPN labs on Mon/Thurs, daily until stable  David Sherman 08/14/2024,9:02 AM

## 2024-08-14 NOTE — Progress Notes (Signed)
 Gave report to [place], RN on 1A. Patient transported via bed to room 160. Handed off chart and medications.

## 2024-08-14 NOTE — Plan of Care (Signed)

## 2024-08-14 NOTE — Progress Notes (Signed)
 Peripherally Inserted Central Catheter Placement  The IV Nurse has discussed with the patient and/or persons authorized to consent for the patient, the purpose of this procedure and the potential benefits and risks involved with this procedure.  The benefits include less needle sticks, lab draws from the catheter, and the patient may be discharged home with the catheter. Risks include, but not limited to, infection, bleeding, blood clot (thrombus formation), and puncture of an artery; nerve damage and irregular heartbeat and possibility to perform a PICC exchange if needed/ordered by physician.  Alternatives to this procedure were also discussed.  Bard Power PICC patient education guide, fact sheet on infection prevention and patient information card has been provided to patient /or left at bedside.    PICC Placement Documentation  PICC Double Lumen 08/14/24 Right Basilic 40 cm 0 cm (Active)  Indication for Insertion or Continuance of Line Administration of hyperosmolar/irritating solutions (i.e. TPN, Vancomycin, etc.) 08/14/24 1500  Exposed Catheter (cm) 0 cm 08/14/24 1500  Site Assessment Clean, Dry, Intact 08/14/24 1500  Lumen #1 Status Flushed;Saline locked;Blood return noted 08/14/24 1500  Lumen #2 Status Flushed;Saline locked;Blood return noted 08/14/24 1500  Dressing Type Transparent;Securing device 08/14/24 1500  Dressing Status Antimicrobial disc/dressing in place 08/14/24 1500  Line Care Connections checked and tightened 08/14/24 1500  Line Adjustment (NICU/IV Team Only) No 08/14/24 1500  Dressing Intervention New dressing;Adhesive placed at insertion site (IV team only) 08/14/24 1500  Dressing Change Due 08/21/24 08/14/24 1500       David  Coulter Sherman 08/14/2024, 3:30 PM

## 2024-08-14 NOTE — Progress Notes (Signed)
 Initial Nutrition Assessment  DOCUMENTATION CODES:   Severe malnutrition in context of chronic illness  INTERVENTION:   TPN per pharmacy   Recommend thiamine  100mg  IV daily added to TPN  Recommend folic acid  1mg  daily added to TPN  Pt at high refeed risk; recommend monitor potassium, magnesium and phosphorus labs daily until stable  Daily weights   NUTRITION DIAGNOSIS:   Severe Malnutrition related to chronic illness (COPD, etoh abuse) as evidenced by severe fat depletion, severe muscle depletion.  GOAL:   Patient will meet greater than or equal to 90% of their needs  MONITOR:   Diet advancement, Labs, Weight trends, Skin, I & O's, Other (Comment) (TPN)  REASON FOR ASSESSMENT:   Consult New TPN/TNA  ASSESSMENT:   58 y/o male with h/o anxiety, depression, tremor, substance abuse, etoh abuse, DDD, chronic pain, inguinal hernia repair, HTN, GERD and COPD who is admitted with perforated prepyloric gastric ulcer now s/p diagnostic laparoscopy with robotic jejunal serosal patch repair 11/2.  Met with pt in room today; pt reports that he is feeling terrible. Pt reports ongoing abdominal pain but reports that its a little better today. NGT in place to LIS with minimal output noted. No BM yet. Per surgery, pt is to remain NPO until 11/6. Plan today is for PICC line and TPN. Pt reports poor appetite and oral intake at baseline. Pt reports that he has been having issues with his stomach for several months. Pt is likely at high refeed risk. Pt is agreeable to supplements with diet advancement. There is no documented weight history to determine if any significant recent changes.   Medications reviewed and include: lovenox , folic acid , insulin, protonix , thiamine , LRS @125ml /hr, Mg sulfate, zosyn  Labs reviewed: K 4.4 wnl, BUN 23(H), P 3.5 wnl, Mg 1.8 wnl Wbc- 16.7(H)  UOP-  Drains-   NUTRITION - FOCUSED PHYSICAL EXAM:  Flowsheet Row Most Recent Value  Orbital  Region Moderate depletion  Upper Arm Region Severe depletion  Thoracic and Lumbar Region Severe depletion  Buccal Region Moderate depletion  Temple Region Moderate depletion  Clavicle Bone Region Severe depletion  Clavicle and Acromion Bone Region Severe depletion  Scapular Bone Region Moderate depletion  Dorsal Hand Moderate depletion  Patellar Region Severe depletion  Anterior Thigh Region Severe depletion  Posterior Calf Region Severe depletion  Edema (RD Assessment) None  Hair Reviewed  Eyes Reviewed  Mouth Reviewed  Skin Reviewed  Nails Reviewed   Diet Order:   Diet Order             Diet NPO time specified  Diet effective now                  EDUCATION NEEDS:   Education needs have been addressed  Skin:  Skin Assessment: Reviewed RN Assessment (incision abdomen)  Last BM:  pta  Height:   Ht Readings from Last 1 Encounters:  08/13/24 6' (1.829 m)    Weight:   Wt Readings from Last 1 Encounters:  08/13/24 61.2 kg    Ideal Body Weight:  80.9 kg  BMI:  Body mass index is 18.31 kg/m.  Estimated Nutritional Needs:   Kcal:  1900-2200kcal/day  Protein:  95-110g/day  Fluid:  1.9-2.2L/day  David Shams MS, RD, LDN If unable to be reached, please send secure chat to RD inpatient available from 8:00a-4:00p daily

## 2024-08-15 ENCOUNTER — Inpatient Hospital Stay

## 2024-08-15 DIAGNOSIS — K251 Acute gastric ulcer with perforation: Secondary | ICD-10-CM | POA: Diagnosis not present

## 2024-08-15 LAB — BASIC METABOLIC PANEL WITH GFR
Anion gap: 7 (ref 5–15)
BUN: 27 mg/dL — ABNORMAL HIGH (ref 6–20)
CO2: 27 mmol/L (ref 22–32)
Calcium: 7.8 mg/dL — ABNORMAL LOW (ref 8.9–10.3)
Chloride: 105 mmol/L (ref 98–111)
Creatinine, Ser: 1.01 mg/dL (ref 0.61–1.24)
GFR, Estimated: >60 mL/min (ref >60)
Glucose, Bld: 94 mg/dL (ref 70–99)
Potassium: 4 mmol/L (ref 3.5–5.1)
Sodium: 139 mmol/L (ref 135–145)

## 2024-08-15 LAB — GLUCOSE, CAPILLARY
Glucose-Capillary: 104 mg/dL — ABNORMAL HIGH (ref 70–99)
Glucose-Capillary: 109 mg/dL — ABNORMAL HIGH (ref 70–99)
Glucose-Capillary: 109 mg/dL — ABNORMAL HIGH (ref 70–99)
Glucose-Capillary: 93 mg/dL (ref 70–99)

## 2024-08-15 LAB — PHOSPHORUS: Phosphorus: 2.2 mg/dL — ABNORMAL LOW (ref 2.5–4.6)

## 2024-08-15 LAB — MAGNESIUM: Magnesium: 2.4 mg/dL (ref 1.7–2.4)

## 2024-08-15 MED ORDER — POTASSIUM PHOSPHATES 15 MMOLE/5ML IV SOLN
15.0000 mmol | Freq: Once | INTRAVENOUS | Status: AC
  Administered 2024-08-15: 15 mmol via INTRAVENOUS
  Filled 2024-08-15: qty 5

## 2024-08-15 MED ORDER — BENZOCAINE 20 % MT AERO
INHALATION_SPRAY | Freq: Four times a day (QID) | OROMUCOSAL | Status: DC | PRN
  Filled 2024-08-15: qty 57

## 2024-08-15 MED ORDER — TRACE MINERALS CU-MN-SE-ZN 300-55-60-3000 MCG/ML IV SOLN
INTRAVENOUS | Status: AC
  Filled 2024-08-15: qty 488

## 2024-08-15 MED ORDER — DEXTROSE 10 % IV SOLN: INTRAVENOUS | Status: AC

## 2024-08-15 MED ORDER — IOHEXOL 300 MG/ML  SOLN
75.0000 mL | Freq: Once | INTRAMUSCULAR | Status: AC | PRN
  Administered 2024-08-15: 75 mL via INTRAVENOUS

## 2024-08-15 NOTE — Plan of Care (Signed)
  Problem: Education: Goal: Knowledge of General Education information will improve Description: Including pain rating scale, medication(s)/side effects and non-pharmacologic comfort measures Outcome: Progressing   Problem: Clinical Measurements: Goal: Diagnostic test results will improve Outcome: Progressing   Problem: Activity: Goal: Risk for activity intolerance will decrease Outcome: Progressing   Problem: Nutrition: Goal: Adequate nutrition will be maintained Outcome: Progressing   Problem: Pain Managment: Goal: General experience of comfort will improve and/or be controlled Outcome: Progressing   Problem: Safety: Goal: Ability to remain free from injury will improve Outcome: Progressing

## 2024-08-15 NOTE — Progress Notes (Signed)
 Pt is complaining of increased nasal pain, throat pain, and activation of his gag reflex ever since the NG tube was advanced. MD Jhonny and PA Taras aware, orders received.

## 2024-08-15 NOTE — Progress Notes (Signed)
 PHARMACY - TOTAL PARENTERAL NUTRITION CONSULT NOTE   Indication: Prolonged ileus  Patient Measurements: Height: 6' (182.9 cm) Weight: 57 kg (125 lb 10.6 oz) IBW/kg (Calculated) : 77.6 TPN AdjBW (KG): 61.2 Body mass index is 17.04 kg/m.  Assessment:  58 y.o. male 1 Day Post-Op s/p robotic assisted diagnostic laparoscopy and jejunal serosal patch repair of perforated gastric ulcer   Glucose / Insulin: SSI Electrolytes: WNL Renal: SCr<1, stable Hepatic: LFTs wnl Intake / Output;  11/02 0701 - 11/03 0700 In: 3168.2 [I.V.:2896.2; IV Piggyback:272] Out: 735 [Urine:575; Drains:110]  GI Imaging: No new pertinent imaging studies  GI Surgeries / Procedures:  11/02 robotic assisted diagnostic laparoscopy and jejunal serosal patch repair of perforated gastric ulcer  ID:  11/[email or web address] Zosyn>> (Day3)   Central access: 11/3 TPN start date: 08/14/24   Nutritional Goals: Goal TPN rate is 75 mL/hr (provides 109.8 g of protein and 2200.7 kcals per day)  RD Assessment: Estimated Needs Total Energy Estimated Needs: 1900-2200kcal/day Total Protein Estimated Needs: 95-110g/day Total Fluid Estimated Needs: 1.9-2.2L/day  Current Nutrition:  NPO  Plan:  ---Increase TPN rate to 43mL/hr at 1800 (goal 81ml/hr)  ---Electrolytes in TPN (standard): Na 50mEq/L, K 57mEq/L, Ca 62mEq/L, Mg 74mEq/L, and Phos 15mmol/L. Cl:Ac 1:1 ---KPhos 15mmol IV x 1  ---Add standard MVI, folic acid  and trace elements to TPN --Thiamine  100mg  IV daily (outside TPN)  ---Initiate Sensitive q6h SSI and adjust as needed  ---Monitor TPN labs on Mon/Thurs, daily until stable  Estill CHRISTELLA Lutes, PharmD, BCPS Clinical Pharmacist 08/15/2024 8:21 AM

## 2024-08-15 NOTE — Progress Notes (Addendum)
 PROGRESS NOTE    ORRIS PERIN  FMW:[id number] DOB: 09/28/1966 DOA: 08/13/2024 PCP: Delfina Pao, MD    Brief Narrative:    David Sherman is a 58 y.o. male with history of hypertension, GERD, depression, anxiety, restless leg syndrome, chronic alcohol use with likely dependence, presents to the emergency department for chief concerns of abdominal pain.   Vitals in the ED showed temperature of 97.7, respiration rate 20, heart rate 54, blood pressure 126/80, SpO2 of 97% on room air.   Serum sodium is 140, potassium 4.0, chloride 104, bicarb 26, BUN of 25, serum creatinine 0.80, eGFR greater than 60, nonfasting blood glucose 93, WBC 7.4, hemoglobin 14.2, platelets of 306.   CT abdomen pelvis with contrast: Was read as pneumoperitoneum in the epigastric region, likely related to perforated distal gastric ulcer.   Patient was taken emergently to the OR for acute abdomen in setting of the above.   Underwent jejunal patch by general surgery on 11/2.  Tolerated well.  Started on TPN postoperatively.  Strict NPO.     Assessment & Plan:   Principal Problem:   Perforated gastric ulcer (HCC) Active Problems:   Pneumoperitoneum   Alcohol abuse   Essential hypertension   COPD (chronic obstructive pulmonary disease) (HCC)   GERD (gastroesophageal reflux disease)   Protein-calorie malnutrition, severe  Alcohol abuse Patient's last drink was 3 days prior to presentation.  Still within the withdrawal window however the likelihood of acute withdrawal is lower at this point. Plan: Continue CIWA protocol with as needed Ativan  for now.  Patient not exhibiting any clinical signs of withdrawal currently.  If patient continues not to exhibit any signs of withdrawal consider discontinuation of CIWA assessments in 24 hours   Pneumoperitoneum Secondary to possible perforated distal gastric ulcer General Surgery is aware and has taken patient emergently to the OR Patient now status post  jejunal patch Plan: Continue strict NPO.  On IV Zosyn.  On TPN per surgery.  Surgery planning upper GI series on 11/6.  This will determine whether we can advance diet safely   COPD (chronic obstructive pulmonary disease) (HCC) Not acutely exacerbated.  Continue as needed nebs   Essential hypertension Continue Home lisinopril As needed hydralazine  ADDENDUM: Spiculated lesion noted on chest x-ray.  Follow-up chest CT with IV contrast demonstrates a cavitary lesion with thickened walls in the peripheral left upper lobe measuring 3.4 x 1.5 cm.  Recommend PET/CT and tissue sampling for further evaluation given size and morphology.  This is unlikely to be accessible via bronchoscope and would likely need percutaneous tissue sampling with interventional radiology.  Considering patient's acute illness I will defer interventional radiology consult at this time.  Once patient gains some more hemodynamic stability consider interventional radiology consult for tissue sampling and further diagnostic evaluation.    DVT prophylaxis: SQ Lovenox  Code Status: Full Family Communication: None Disposition Plan: Status is: Inpatient Remains inpatient appropriate because: Perforated gastric ulcer   Level of care: Med-Surg  Consultants:  Surgery  Procedures:  Ex lap  Antimicrobials: Zosyn    Subjective: Seen and.  Fatigued otherwise stable.  Objective: Vitals:   08/15/24 0500 08/15/24 0542 08/15/24 0830 08/15/24 0830  BP:  123/69 134/78   Pulse:  62 63   Resp:  17  16  Temp:  99.6 F (37.6 C) 98.4 F (36.9 C)   TempSrc:   Oral   SpO2:  97% 98%   Weight: 57 kg     Height:  Intake/Output Summary (Last 24 hours) at 08/15/2024 1300 Last data filed at 08/15/2024 0600 Gross per 24 hour  Intake 385.46 ml  Output 1315 ml  Net -929.54 ml   Filed Weights   08/13/24 1124 08/15/24 0500  Weight: 61.2 kg 57 kg    Examination:  General exam: Appears weak and  deconditioned Respiratory system: Scattered crackles bilaterally.  Normal work of breathing.  Room air Cardiovascular system: S1-S2, RRR, no murmurs, no pedal edema Gastrointestinal system: Midline surgical incision, incision CDI.  Decreased bowel sounds Central nervous system: Alert and oriented. No focal neurological deficits. Extremities: Symmetric 5 x 5 power. Skin: No rashes, lesions or ulcers Psychiatry: Judgement and insight appear normal. Mood & affect appropriate.     Data Reviewed: I have personally reviewed following labs and imaging studies  CBC: Recent Labs  Lab 08/13/24 1125 08/14/24 0528  WBC 7.4 16.7*  HGB 14.2 12.3*  HCT 41.8 36.2*  MCV 101.2* 100.8*  PLT 306 196   Basic Metabolic Panel: Recent Labs  Lab 08/13/24 1125 08/14/24 0528 08/15/24 0658  NA 140 138 139  K 4.0 4.4 4.0  CL 104 107 105  CO2 26 22 27   GLUCOSE 93 108* 94  BUN 25* 23* 27*  CREATININE 0.80 0.90 1.01  CALCIUM 8.5* 7.8* 7.8*  MG  --  1.8 2.4  PHOS  --  3.5 2.2*   GFR: Estimated Creatinine Clearance: 65.1 mL/min (by C-G formula based on SCr of 1.01 mg/dL). Liver Function Tests: Recent Labs  Lab 08/13/24 1125  AST 23  ALT 15  ALKPHOS 67  BILITOT 1.0  PROT 6.7  ALBUMIN 3.6   Recent Labs  Lab 08/13/24 1125  LIPASE 33   No results for input(s): AMMONIA in the last 168 hours. Coagulation Profile: Recent Labs  Lab 08/13/24 1411  INR 1.5*   Cardiac Enzymes: No results for input(s): CKTOTAL, CKMB, CKMBINDEX, TROPONINI in the last 168 hours. BNP (last 3 results) No results for input(s): PROBNP in the last 8760 hours. HbA1C: No results for input(s): HGBA1C in the last 72 hours. CBG: Recent Labs  Lab 08/13/24 1918 08/15/24 0016 08/15/24 0548  GLUCAP 121* 109* 109*   Lipid Profile: No results for input(s): CHOL, HDL, LDLCALC, TRIG, CHOLHDL, LDLDIRECT in the last 72 hours. Thyroid  Function Tests: No results for input(s): TSH, T4TOTAL,  FREET4, T3FREE, THYROIDAB in the last 72 hours. Anemia Panel: No results for input(s): VITAMINB12, FOLATE, FERRITIN, TIBC, IRON, RETICCTPCT in the last 72 hours. Sepsis Labs: No results for input(s): PROCALCITON, LATICACIDVEN in the last 168 hours.  Recent Results (from the past 240 hours)  MRSA Next Gen by PCR, Nasal     Status: Abnormal   Collection Time: 08/13/24  7:53 PM   Specimen: Nasal Mucosa; Nasal Swab  Result Value Ref Range Status   MRSA by PCR Next Gen DETECTED (A) NOT DETECTED Final    Comment: RESULT CALLED TO, READ BACK BY AND VERIFIED WITH: EBONY COOPER, RN @0125  08/14/2024 COP (NOTE) The GeneXpert MRSA Assay (FDA approved for NASAL specimens only), is one component of a comprehensive MRSA colonization surveillance program. It is not intended to diagnose MRSA infection nor to guide or monitor treatment for MRSA infections. Test performance is not FDA approved in patients less than 76 years old. Performed at [facility], [address] [place], [place] [address]          Radiology Studies: [facility] Chest [place] 1 View Result Date: 08/15/2024 EXAM: 1 VIEW(S) XRAY OF  THE CHEST 08/14/2024 01:38:40 PM COMPARISON: 03/10/2019 CLINICAL HISTORY: Cough FINDINGS: LINES, TUBES AND DEVICES: There is an enteric tube with tip in the expected location of the proximal gastric fundus. The side hole is 2.2 cm above the hemidiaphragms. LUNGS AND PLEURA: Spiculated nodule in the periphery of the left upper lobe measures 3 cm. No pulmonary edema. No pleural effusion. No pneumothorax. HEART AND MEDIASTINUM: No acute abnormality of the cardiac and mediastinal silhouettes. BONES AND SOFT TISSUES: No acute osseous abnormality. IMPRESSION: 1. Spiculated 3 cm left upper lobe pulmonary nodule, suspicious for malignancy. Recommend more definitive characterization with dedicated CT of the chest. 2. The side hole for the enteric tube is above the ge junction. Recommend  advancement for placement 3. The urgent finding will be called to the ordering provider by the Professional Radiology Assistants (PRAs) and documented in the [facility] dashboard. . Electronically signed by: Waddell Calk MD 08/15/2024 08:53 AM EST RP Workstation: [id number]   US  EKG SITE RITE Result Date: 08/14/2024 If Site Rite image not attached, placement could not be confirmed due to current cardiac rhythm.  DG Abd 1 View Result Date: 08/13/2024 EXAM: 1 VIEW XRAY OF THE ABDOMEN 08/13/2024 07:16:00 PM COMPARISON: None available. CLINICAL HISTORY: [id number] Encounter for nasogastric (NG) tube placement [id number] Encounter for nasogastric (NG) tube placement FINDINGS: LINES, TUBES AND DEVICES: Enteric tube coursing below the hemidiaphragm with tip overlying the expected region of the gastric lumen and side port overlying the expected gastroesophageal. Weighted enteric tube with the tip overlying the expected region of the gastric lumen. A surgical drain overlies the abdomen. BOWEL: Nonobstructive bowel gas pattern. SOFT TISSUES: No opaque urinary calculi. BONES: No acute osseous abnormality. IMPRESSION: 1. Enteric tube coursing below the hemidiaphragm with tip overlying the expected region of the gastric lumen and side port overlying the expected gastroesophageal. 2. Weighted enteric tube with the tip overlying the expected region of the gastric lumen. 3. Surgical drain projects over the abdomen. Electronically signed by: Morgane Naveau MD 08/13/2024 07:22 PM EST RP Workstation: [id number]   CT ABDOMEN PELVIS W CONTRAST Result Date: 08/13/2024 EXAM: CT ABDOMEN AND PELVIS WITH CONTRAST 08/13/2024 01:16:25 PM TECHNIQUE: CT of the abdomen and pelvis was performed with the administration of 100 mL of iohexol (OMNIPAQUE) 300 MG/ML solution. Multiplanar reformatted images are provided for review. Automated exposure control, iterative reconstruction, and/or weight-based adjustment of the mA/kV was utilized to reduce the  radiation dose to as low as reasonably achievable. COMPARISON: None available. CLINICAL HISTORY: Abdominal pain, acute, nonlocalized. FINDINGS: LOWER CHEST: No acute abnormality. LIVER: Hepatic steatosis. GALLBLADDER AND BILE DUCTS: Gallbladder is unremarkable. No biliary ductal dilatation. SPLEEN: No acute abnormality. PANCREAS: No acute abnormality. ADRENAL GLANDS: No acute abnormality. KIDNEYS, URETERS AND BLADDER: No stones in the kidneys or ureters. No hydronephrosis. No perinephric or periureteral stranding. Urinary bladder is unremarkable. GI AND BOWEL: Pneumoperitoneum is noted in the epigastric region which appears to be most likely due to perforated distal gastric ulcer. There is no bowel obstruction. PERITONEUM AND RETROPERITONEUM: Pneumoperitoneum is noted in the epigastric region which appears to be most likely due to perforated distal gastric ulcer. No ascites. VASCULATURE: Aortic atherosclerosis. Aorta is normal in caliber. LYMPH NODES: No lymphadenopathy. REPRODUCTIVE ORGANS: No acute abnormality. BONES AND SOFT TISSUES: No acute osseous abnormality. No focal soft tissue abnormality. IMPRESSION: 1. Pneumoperitoneum in the epigastric region, likely related to a perforated distal gastric ulcer. This finding was discussed with Devere Perry, PA, at 01:30 pm on 08/13/2024. 2. Hepatic steatosis. Electronically signed by:  Lynwood Seip MD 08/13/2024 01:31 PM EST RP Workstation: [id number]        Scheduled Meds:  Chlorhexidine Gluconate Cloth  6 each Topical Daily   enoxaparin  (LOVENOX ) injection  40 mg Subcutaneous Q24H   insulin aspart  0-9 Units Subcutaneous Q6H   methocarbamol (ROBAXIN) injection  500 mg Intravenous Q8H   mupirocin ointment   Nasal BID   pantoprazole  (PROTONIX ) IV  40 mg Intravenous Q12H   sodium chloride  flush  10-40 mL Intracatheter Q12H   thiamine  (VITAMIN B1) injection  100 mg Intravenous Daily   Continuous Infusions:  chlorproMAZINE (THORAZINE) 12.5 mg in sodium  chloride 0.9 % 25 mL IVPB Stopped (08/14/24 1644)   fluconazole (DIFLUCAN) IV     piperacillin-tazobactam (ZOSYN)  IV 3.375 g (08/15/24 0559)   potassium PHOSPHATE IVPB (in mmol) 15 mmol (08/15/24 1022)   TPN ADULT (ION) 25 mL/hr at 08/15/24 0306   TPN ADULT (ION)       LOS: 2 days      Calvin KATHEE Robson, MD [facility]   If 7PM-7AM, please contact night-coverage  08/15/2024, 1:00 PM

## 2024-08-15 NOTE — Plan of Care (Signed)

## 2024-08-15 NOTE — Progress Notes (Signed)
 [facility] SURGICAL PROGRESS NOTE  Hospital Day(s): 2.   Post op day(s): 2 Days Post-Op.   Interval History:  Patient seen and examined No acute events or new complaints overnight.  Patient reports abdomen is sore; worse with certain positions but improved significantly from presentation No fever, chills, emesis Labs this morning are pending Surgical drain with 290 ccs out; serous NGT in place; 575 ccs He is on Zosyn  TPN  Vital signs in last 24 hours: [min-max] current  Temp:  [98 F (36.7 C)-99.6 F (37.6 C)] 99.6 F (37.6 C) (11/04 0542) Pulse Rate:  [52-81] 62 (11/04 0542) Resp:  [13-26] 17 (11/04 0542) BP: (94-123)/(55-76) 123/69 (11/04 0542) SpO2:  [94 %-100 %] 97 % (11/04 0542) Weight:  [57 kg] 57 kg (11/04 0500)     Height: 6' (182.9 cm) Weight: 57 kg BMI (Calculated): 17.04   Intake/Output last 2 shifts:  11/03 0701 - 11/04 0700 In: 1148.6 [I.V.:788.4; IV Piggyback:360.2] Out: 1475 [Urine:610; Emesis/NG output:575; Drains:290]   Physical Exam:  Constitutional: alert, cooperative and no distress  HEENT: NGT in place Respiratory: breathing non-labored at rest  Cardiovascular: regular rate and sinus rhythm  Gastrointestinal: soft, incisional soreness, and non-distended, no rebound/guarding. Surgical drain in place; output serous Integumentary: Laparoscopic incisions are CDI with dermabond   Labs:     Latest Ref Rng & Units 08/14/2024    5:28 AM 08/13/2024   11:25 AM 03/12/2019    8:05 AM  CBC  WBC 4.0 - 10.5 K/uL 16.7  7.4  13.4   Hemoglobin 13.0 - 17.0 g/dL 87.6  85.7  87.4   Hematocrit 39.0 - 52.0 % 36.2  41.8  34.8   Platelets 150 - 400 K/uL 196  306  151       Latest Ref Rng & Units 08/14/2024    5:28 AM 08/13/2024   11:25 AM 03/12/2019    5:01 AM  CMP  Glucose 70 - 99 mg/dL 891  93  894   BUN 6 - 20 mg/dL 23  25  6    Creatinine 0.61 - 1.24 mg/dL 9.09  9.19  9.28   Sodium 135 - 145 mmol/L 138  140  138   Potassium 3.5 - 5.1 mmol/L  4.4  4.0  3.1   Chloride 98 - 111 mmol/L 107  104  107   CO2 22 - 32 mmol/L 22  26  23    Calcium 8.9 - 10.3 mg/dL 7.8  8.5  7.6   Total Protein 6.5 - 8.1 g/dL  6.7    Total Bilirubin 0.0 - 1.2 mg/dL  1.0    Alkaline Phos 38 - 126 U/L  67    AST 15 - 41 U/L  23    ALT 0 - 44 U/L  15       Imaging studies: No new pertinent imaging studies   Assessment/Plan:  58 y.o. male 2 Days Post-Op s/p robotic assisted diagnostic laparoscopy and jejunal serosal patch repair of perforated gastric ulcer    - He will need to be STRICT NPO until 11/06. We will plan for UGI on this date to reassess repair; ordered.   - Continue TPN; monitor electrolytes; advance to goal   - Continue NGT decompression; LIS; monitor and record output   - Continue IV Abx (Zosyn)  - Monitor abdominal examination  - IV PPI BID - Monitor leukocytosis; likely reactive in nature  - Pain control prn; avoid NSAID - Antiemetics prn - Okay to mobilize;  engage therapies if needed; can clamp NGT for mobilization - Further management per primary service; we will follow    All of the above findings and recommendations were discussed with the patient, and the medical team, and all of patient's questions were answered to his expressed satisfaction.  -- Arthea Platt, PA-C [facility] 08/15/2024, 7:05 AM M-F: 7am - 4pm\

## 2024-08-15 NOTE — Progress Notes (Signed)
 Per PA Terryl, NG tube advanced 5 cm. Pt tolerated fair. NG tube secured by bridle.

## 2024-08-15 NOTE — TOC CM/SW Note (Signed)
 Transition of Care (TOC) CM/SW Note   .Transition of Care [facility]) - Inpatient Brief Assessment   Patient Details  Name: David Sherman MRN: [id number] Date of Birth: 06-May-1966  Transition of [facility]) CM/SW Contact:    Alvaro Louder, LCSW Phone Number: 08/15/2024, 9:29 AM   Clinical Narrative: Per Consult Alcohol and smoke cessation   placed in AVS. LCSWA to follow up with MD regarding medication assistance.  No other TOC needs at this time. Please consult if they arise.  TOC to follow for discharge  Transition of Care Asessment: Insurance and Status: Insurance coverage has been reviewed Patient has primary care physician: Yes Home environment has been reviewed: Single family home Prior level of function:: Fully oriented Prior/Current Home Services: No current home services Social Drivers of Health Review: SDOH reviewed needs interventions Readmission risk has been reviewed: No Transition of care needs: transition of care needs identified, TOC will continue to follow

## 2024-08-16 DIAGNOSIS — K251 Acute gastric ulcer with perforation: Secondary | ICD-10-CM | POA: Diagnosis not present

## 2024-08-16 LAB — GLUCOSE, CAPILLARY
Glucose-Capillary: 116 mg/dL — ABNORMAL HIGH (ref 70–99)
Glucose-Capillary: 123 mg/dL — ABNORMAL HIGH (ref 70–99)
Glucose-Capillary: 126 mg/dL — ABNORMAL HIGH (ref 70–99)
Glucose-Capillary: 127 mg/dL — ABNORMAL HIGH (ref 70–99)
Glucose-Capillary: 134 mg/dL — ABNORMAL HIGH (ref 70–99)

## 2024-08-16 LAB — BASIC METABOLIC PANEL WITH GFR
Anion gap: 7 (ref 5–15)
BUN: 21 mg/dL — ABNORMAL HIGH (ref 6–20)
CO2: 24 mmol/L (ref 22–32)
Calcium: 7.8 mg/dL — ABNORMAL LOW (ref 8.9–10.3)
Chloride: 107 mmol/L (ref 98–111)
Creatinine, Ser: 0.85 mg/dL (ref 0.61–1.24)
GFR, Estimated: >60 mL/min (ref >60)
Glucose, Bld: 118 mg/dL — ABNORMAL HIGH (ref 70–99)
Potassium: 3.8 mmol/L (ref 3.5–5.1)
Sodium: 138 mmol/L (ref 135–145)

## 2024-08-16 LAB — MAGNESIUM: Magnesium: 2.1 mg/dL (ref 1.7–2.4)

## 2024-08-16 LAB — PHOSPHORUS: Phosphorus: 3 mg/dL (ref 2.5–4.6)

## 2024-08-16 MED ORDER — TRACE MINERALS CU-MN-SE-ZN 300-55-60-3000 MCG/ML IV SOLN
INTRAVENOUS | Status: AC
  Filled 2024-08-16: qty 732

## 2024-08-16 NOTE — Plan of Care (Signed)

## 2024-08-16 NOTE — Plan of Care (Signed)
  Problem: Clinical Measurements: Goal: Diagnostic test results will improve Outcome: Progressing   Problem: Nutrition: Goal: Adequate nutrition will be maintained Outcome: Progressing   Problem: Coping: Goal: Level of anxiety will decrease Outcome: Progressing   Problem: Pain Managment: Goal: General experience of comfort will improve and/or be controlled Outcome: Progressing   Problem: Safety: Goal: Ability to remain free from injury will improve Outcome: Progressing   Problem: Coping: Goal: Ability to adjust to condition or change in health will improve Outcome: Progressing

## 2024-08-16 NOTE — Progress Notes (Signed)
 PHARMACY - TOTAL PARENTERAL NUTRITION CONSULT NOTE   Indication: Prolonged ileus  Patient Measurements: Height: 6' (182.9 cm) Weight: 57.2 kg (126 lb 1.7 oz) IBW/kg (Calculated) : 77.6 TPN AdjBW (KG): 61.2 Body mass index is 17.1 kg/m.  Assessment:  58 y.o. male 1 Day Post-Op s/p robotic assisted diagnostic laparoscopy and jejunal serosal patch repair of perforated gastric ulcer   Glucose / Insulin: SSI, BG: 90-120s 2 units insulin last 24 hours  Electrolytes: WNL Renal: SCr<1, stable Hepatic: LFTs wnl Intake / Output;  11/02 0701 - 11/03 0700 In: 3168.2 [I.V.:2896.2; IV Piggyback:272] Out: 735 [Urine:575; Drains:110]  GI Imaging: No new pertinent imaging studies  GI Surgeries / Procedures:  11/02 robotic assisted diagnostic laparoscopy and jejunal serosal patch repair of perforated gastric ulcer  ID:  11/2 Zosyn>> (Day3)   Central access: 11/3 TPN start date: 08/14/24   Nutritional Goals: Goal TPN rate is 75 mL/hr (provides 109.8 g of protein and 2200.7 kcals per day)  RD Assessment: Estimated Needs Total Energy Estimated Needs: 1900-2200kcal/day Total Protein Estimated Needs: 95-110g/day Total Fluid Estimated Needs: 1.9-2.2L/day  Current Nutrition:  NPO  Plan:  ---Increase TPN rate to goal rate of 53mL/hr at 1800  ---Electrolytes in TPN (standard): Na 50mEq/L, K 94mEq/L, Ca 6mEq/L, Mg 68mEq/L, and Phos 15mmol/L. Cl:Ac 1:1 ---Add standard MVI, folic acid  and trace elements to TPN --Thiamine  100mg  IV daily (outside TPN)  ---Initiate Sensitive q6h SSI and adjust as needed  ---Monitor TPN labs on Mon/Thurs, daily until stable  Estill CHRISTELLA Lutes, PharmD, BCPS Clinical Pharmacist 08/16/2024 9:24 AM

## 2024-08-16 NOTE — Evaluation (Signed)
 Occupational Therapy Evaluation Patient Details Name: David Sherman MRN: [id number] DOB: 06/07/1966 Today's Date: 08/16/2024   History of Present Illness   David Sherman is a 58 y.o. male with history of hypertension, GERD, depression, anxiety, restless leg syndrome, chronic alcohol use with likely dependence, presents to the emergency department for chief concerns of abdominal pain. S/P  robotic assisted diagnostic laparoscopy and jejunal serosal patch repair of perforated gastric ulcer on 08/13/24.     Clinical Impressions Upon entering the room, pt in bed and agreeable to OT intervention. Pt reports living at home with brother and friend and being Ind at baseline. Pt does not drive but reports, sometimes I have to. Pt reports abdominal pain of 8/10 but unable to get any additional medication at this time. Pt is able to demonstrate figure four position for pain management for LB dressing independence. Pt is able to independently ambulate in room today without assistance. He declines further mobility at this time secondary to pain. Pt does not have further acute OT needs. OT to complete order at this time and pt agrees.       Functional Status Assessment   Patient has not had a recent decline in their functional status     Equipment Recommendations   None recommended by OT      Precautions/Restrictions   Precautions Precautions: Fall Recall of Precautions/Restrictions: Impaired     Mobility Bed Mobility Overal bed mobility: Independent                  Transfers Overall transfer level: Independent Equipment used: None                          ADL either performed or assessed with clinical judgement   ADL Overall ADL's : Modified independent                                       General ADL Comments: modified technique such as figure four secondary to pain     Vision Patient Visual Report: No change from baseline               Pertinent Vitals/Pain Pain Assessment Pain Assessment: 0-10 Pain Score: 8  Pain Location: abdomen, back Pain Descriptors / Indicators: Aching, Grimacing, Sore Pain Intervention(s): Monitored during session, Premedicated before session, Repositioned     Extremity/Trunk Assessment Upper Extremity Assessment Upper Extremity Assessment: Generalized weakness   Lower Extremity Assessment Lower Extremity Assessment: Generalized weakness       Communication Communication Communication: No apparent difficulties   Cognition Arousal: Alert Behavior During Therapy: WFL for tasks assessed/performed Cognition: No apparent impairments                               Following commands: Intact       Cueing  General Comments   Cueing Techniques: Verbal cues              Home Living Family/patient expects to be discharged to:: Private residence Living Arrangements: Other relatives;Non-relatives/Friends Available Help at Discharge: Family;Available 24 hours/day Type of Home: House Home Access: Stairs to enter [employer] of Steps: 4-5 Entrance Stairs-Rails: Right;Left;Can reach both Home Layout: One level     Bathroom Shower/Tub: Chief Strategy Officer: Standard     Home Equipment: None  Prior Functioning/Environment Prior Level of Function : Independent/Modified Independent             Mobility Comments: Pt reports being IND with mobility, no previous AD use, states he was not very active PTA due to back/knee issues. One fall in the last 6 months where his L knee gave out on him. ADLs Comments: IND with ADLs            OT Goals(Current goals can be found in the care plan section)   Acute Rehab OT Goals Patient Stated Goal: to go home OT Goal Formulation: With patient Time For Goal Achievement: 08/16/24 Potential to Achieve Goals: Fair   AM-PAC OT 6 Clicks Daily Activity     Outcome Measure  Help from another person eating meals?: None Help from another person taking care of personal grooming?: None Help from another person toileting, which includes using toliet, bedpan, or urinal?: None Help from another person bathing (including washing, rinsing, drying)?: None Help from another person to put on and taking off regular upper body clothing?: None Help from another person to put on and taking off regular lower body clothing?: None 6 Click Score: 24   End of Session Nurse Communication: Mobility status  Activity Tolerance: Patient tolerated treatment well Patient left: in bed                   Time: 8496-8486 OT Time Calculation (min): 10 min Charges:  OT General Charges $OT Visit: 1 Visit OT Evaluation $OT Eval Low Complexity: 1 Low Izetta Claude, MS, OTR/L , CBIS ascom [phone]  08/16/24, 3:21 PM

## 2024-08-16 NOTE — Progress Notes (Signed)
 Nutrition Follow-up  DOCUMENTATION CODES:   Severe malnutrition in context of chronic illness  INTERVENTION:   -TPN management per pharmacy -Continue 1 mg folic acid  daily -Continue 100 mg thiamine  daily -Monitor Mg, K, and Phos and replete as needed -Continue daily weights   NUTRITION DIAGNOSIS:   Severe Malnutrition related to chronic illness (COPD, etoh abuse) as evidenced by severe fat depletion, severe muscle depletion.  Ongoing  GOAL:   Patient will meet greater than or equal to 90% of their needs  Met with TPN  MONITOR:   Diet advancement, Labs, Weight trends, Skin, I & O's, Other (Comment) (TPN)  REASON FOR ASSESSMENT:   Consult New TPN/TNA  ASSESSMENT:   58 y/o male with h/o anxiety, depression, tremor, substance abuse, etoh abuse, DDD, chronic pain, inguinal hernia repair, HTN, GERD and COPD who is admitted with perforated prepyloric gastric ulcer now s/p diagnostic laparoscopy with robotic jejunal serosal patch repair 11/2.  11/2- s/p Procedure: Diagnostic laparoscopy, robotic jejunal serosal patch repair of perforated gastric ulcer  11/3- PICC placed, TPN initiated  Reviewed I/O's: -1.3 L x 24 hours and +843 ml since admission  UOP: 500 ml x 24 hours  NGT output: 1.3 L x 24 hours  Drain output: 85 ml x 24 hours  Per general surgery notes, plan strict NPO until 08/17/24. Plan for UGI on that date to assess repair.  Patient is NPO and with NGT connected to low, intermittent suction. Patient receiving TPN for sole source nutrition at 75 ml/hr, which provides 2201 kcals and 110 grams protein, meeting 100% of estimated needs.    Medications reviewed and include lovenox , protonix , and thiamine .   Labs reviewed: K, Mg, and Phos WDL. CBGS: 93-134 (inpatient orders for glycemic control are 0-9 units insulin aspart every 6 hours).    Diet Order:   Diet Order             Diet NPO time specified  Diet effective now                   EDUCATION  NEEDS:   Education needs have been addressed  Skin:  Skin Assessment: Reviewed RN Assessment (incision abdomen)  Last BM:  Unknown  Height:   Ht Readings from Last 1 Encounters:  08/13/24 6' (1.829 m)    Weight:   Wt Readings from Last 1 Encounters:  08/16/24 57.2 kg    Ideal Body Weight:  80.9 kg  BMI:  Body mass index is 17.1 kg/m.  Estimated Nutritional Needs:   Kcal:  1900-2200kcal/day  Protein:  95-110g/day  Fluid:  1.9-2.2L/day    Margery ORN, RD, LDN, CDCES Registered Dietitian III Certified Diabetes Care and Education Specialist If unable to reach this RD, please use RD Inpatient group chat on secure chat between hours of 8am-4 pm daily

## 2024-08-16 NOTE — Evaluation (Signed)
 Physical Therapy Evaluation Patient Details Name: David Sherman MRN: [id number] DOB: 1966/08/27 Today's Date: 08/16/2024  History of Present Illness  David Sherman is a 58 y.o. male with history of hypertension, GERD, depression, anxiety, restless leg syndrome, chronic alcohol use with likely dependence, presents to the emergency department for chief concerns of abdominal pain. S/P  robotic assisted diagnostic laparoscopy and jejunal serosal patch repair of perforated gastric ulcer on 08/13/24.   Clinical Impression  Pt A&Ox4, agreeable to participate in PT evaluation. At baseline, pt is IND with mobility/ADLs, reports 1 recent fall where his L knee gave out on him. Pt was received in bed, able to perform bed mobility modI and STS transfers independently. Pt amb ~330ft while pushing IV pole with RUE, no LOB throughout, intermittent L knee hyperextension in stance with VC for foot placement to prevent catching on IV pole. Pt was left semi-reclined in bed at end of session with all needs in reach. Pt appears to be near his baseline mobility level, would benefit from acute follow-up with Mobility Specialist to ensure continuation of mobility during hospitalization. PT to sign-off, can be re-consulted if acute need arises.         If plan is discharge home, recommend the following: A little help with walking and/or transfers;A little help with bathing/dressing/bathroom;Assist for transportation;Help with stairs or ramp for entrance   Can travel by private vehicle        Equipment Recommendations None recommended by PT  Recommendations for Other Services       Functional Status Assessment Patient has had a recent decline in their functional status and demonstrates the ability to make significant improvements in function in a reasonable and predictable amount of time.     Precautions / Restrictions Precautions Precautions: Fall Recall of Precautions/Restrictions:  Impaired Restrictions Weight Bearing Restrictions Per Provider Order: No      Mobility  Bed Mobility Overal bed mobility: Modified Independent             General bed mobility comments: no physical assistance needed, itermittent use of bed features    Transfers Overall transfer level: Independent Equipment used: None               General transfer comment: pt able to perform STS transfers with no AD from lowest bed height    Ambulation/Gait Ambulation/Gait assistance: Contact guard assist Gait Distance (Feet): 300 Feet Assistive device: IV Pole Gait Pattern/deviations: Step-through pattern, Decreased stride length, Knee hyperextension - left, Narrow base of support       General Gait Details: no LOB throughout, used IV pole for RUE support with VC for foot positioning to prevent catching foot on IV pole. Intermittent L knee hyperextension in stance, steady cadence  Stairs            Wheelchair Mobility     Tilt Bed    Modified Rankin (Stroke Patients Only)       Balance Overall balance assessment: Mild deficits observed, not formally tested                                           Pertinent Vitals/Pain Pain Assessment Pain Assessment: 0-10 Pain Score: 8  Pain Location: abdomen, back Pain Descriptors / Indicators: Aching, Grimacing, Sore Pain Intervention(s): Monitored during session, Premedicated before session, Repositioned    Home Living Family/patient expects to be discharged to:: Private  residence Living Arrangements: Other relatives;Non-relatives/Friends (brother, friends) Available Help at Discharge: Family;Available 24 hours/day Type of Home: House Home Access: Stairs to enter Entrance Stairs-Rails: Right;Left;Can reach both Entrance Stairs-Number of Steps: 4-5   Home Layout: One level Home Equipment: None      Prior Function Prior Level of Function : Independent/Modified Independent              Mobility Comments: Pt reports being IND with mobility, no previous AD use, states he was not very active PTA due to back/knee issues. One fall in the last 6 months where his L knee gave out on him. ADLs Comments: IND with ADLs     Extremity/Trunk Assessment   Upper Extremity Assessment Upper Extremity Assessment: Generalized weakness    Lower Extremity Assessment Lower Extremity Assessment: Generalized weakness       Communication   Communication Communication: No apparent difficulties    Cognition Arousal: Alert Behavior During Therapy: WFL for tasks assessed/performed   PT - Cognitive impairments: No apparent impairments                       PT - Cognition Comments: A&Ox4, cooperative throughout Following commands: Intact       Cueing Cueing Techniques: Verbal cues     General Comments      Exercises     Assessment/Plan    PT Assessment Patient does not need any further PT services  PT Problem List         PT Treatment Interventions      PT Goals (Current goals can be found in the Care Plan section)  Acute Rehab PT Goals Patient Stated Goal: to go home PT Goal Formulation: All assessment and education complete, DC therapy    Frequency       Co-evaluation               AM-PAC PT 6 Clicks Mobility  Outcome Measure Help needed turning from your back to your side while in a flat bed without using bedrails?: None Help needed moving from lying on your back to sitting on the side of a flat bed without using bedrails?: None Help needed moving to and from a bed to a chair (including a wheelchair)?: None Help needed standing up from a chair using your arms (e.g., wheelchair or bedside chair)?: None Help needed to walk in hospital room?: None Help needed climbing 3-5 steps with a railing? : A Little 6 Click Score: 23    End of Session Equipment Utilized During Treatment: Gait belt Activity Tolerance: Patient tolerated treatment  well Patient left: in bed;with call bell/phone within reach;with bed alarm set Nurse Communication: Mobility status PT Visit Diagnosis: Muscle weakness (generalized) (M62.81);Pain Pain - part of body:  (abdomen)    Time: 8665-8645 PT Time Calculation (min) (ACUTE ONLY): 20 min   Charges:                 Myishia Kasik, SPT

## 2024-08-16 NOTE — Progress Notes (Signed)
 Progress Note   Patient: David Sherman FMW:[id number] DOB: [birth year] DOA: 08/13/2024     3 DOS: the patient was seen and examined on 08/16/2024   Brief hospital course:  David Sherman is a 58 y.o. male with history of hypertension, GERD, depression, anxiety, restless leg syndrome, chronic alcohol use with likely dependence, presents to the emergency department for chief concerns of abdominal pain. CT abdomen pelvis with contrast: Was read as pneumoperitoneum in the epigastric region, likely related to perforated distal gastric ulcer. Patient was taken emergently to the OR for acute abdomen in setting of the above.    Underwent jejunal patch by general surgery on 11/2.  Tolerated well.  Started on TPN postoperatively.  Strict NPO.     Assessment & Plan:   Principal Problem:   Perforated gastric ulcer (HCC) Active Problems:   Pneumoperitoneum   Alcohol abuse   Essential hypertension   COPD (chronic obstructive pulmonary disease) (HCC)   GERD (gastroesophageal reflux disease)   Protein-calorie malnutrition, severe  Alcohol abuse Continue CIWA  Pneumoperitoneum Secondary to possible perforated distal gastric ulcer General Surgery is aware and has taken patient emergently to the OR Patient now status post jejunal patch Plan: Continue strict NPO.   Continue on IV Zosyn.   Continue on TPN per surgery.     COPD (chronic obstructive pulmonary disease) (HCC) Not acutely exacerbated.  Continue as needed nebs   Essential hypertension Continue Home lisinopril Continue    Cavitary lung lesion  spiculated lesion noted on chest x-ray.  Follow-up chest CT with IV contrast demonstrates a cavitary lesion with thickened walls in the peripheral left upper lobe measuring 3.4 x 1.5 cm.  Recommend PET/CT and tissue sampling once patient is medically stable     DVT prophylaxis: SQ Lovenox  Code Status: Full Family Communication: None Disposition Plan: Status is:  Inpatient Remains inpatient appropriate because: Perforated gastric ulcer     Level of care: Med-Surg   Consultants:  Surgery   Procedures:  Ex lap   Subjective:  Patient seen and examined at bedside this morning Still has NG tube in place as well as TPN running He denied nausea vomiting abdominal pain or chest pain  Physical Exam:  General exam: Appears weak and deconditioned Respiratory system: Scattered crackles bilaterally.  Normal work of breathing.  Room air Cardiovascular system: S1-S2, RRR, no murmurs, no pedal edema Gastrointestinal system: Midline surgical incision, incision CDI.  Decreased bowel sounds Central nervous system: Alert and oriented. No focal neurological deficits. Extremities: Symmetric 5 x 5 power. Skin: No rashes, lesions or ulcers Psychiatry: Judgement and insight appear normal. Mood & affect appropriate.   Vitals:   08/16/24 0507 08/16/24 0810 08/16/24 1230 08/16/24 1634  BP: 132/75 133/74 122/70 130/69  Pulse: (!) 57 (!) 56 (!) 55 (!) 59  Resp: 18 20 18 17   Temp: 99.4 F (37.4 C) 98.4 F (36.9 C) 98.8 F (37.1 C) 98.3 F (36.8 C)  TempSrc: Oral Oral    SpO2: 97% 98% 97% 99%  Weight: 57.2 kg     Height:        Data Reviewed:    Latest Ref Rng & Units 08/14/2024    5:28 AM 08/13/2024   11:25 AM 03/12/2019    8:05 AM  CBC  WBC 4.0 - 10.5 K/uL 16.7  7.4  13.4   Hemoglobin 13.0 - 17.0 g/dL 87.6  85.7  87.4   Hematocrit 39.0 - 52.0 % 36.2  41.8  34.8   Platelets 150 -  400 K/uL 196  306  151        Latest Ref Rng & Units 08/16/2024    5:03 AM 08/15/2024    6:58 AM 08/14/2024    5:28 AM  BMP  Glucose 70 - 99 mg/dL 881  94  891   BUN 6 - 20 mg/dL 21  27  23    Creatinine 0.61 - 1.24 mg/dL 9.14  8.98  9.09   Sodium 135 - 145 mmol/L 138  139  138   Potassium 3.5 - 5.1 mmol/L 3.8  4.0  4.4   Chloride 98 - 111 mmol/L 107  105  107   CO2 22 - 32 mmol/L 24  27  22    Calcium 8.9 - 10.3 mg/dL 7.8  7.8  7.8     Author: Drue ONEIDA Potter,  MD 08/16/2024 5:29 PM  For on call review [email or web address].

## 2024-08-16 NOTE — Progress Notes (Signed)
 PT Cancellation Note  Patient Details Name: David Sherman MRN: [id number] DOB: Jun 02, 1966   Cancelled Treatment:    Reason Eval/Treat Not Completed: Pain limiting ability to participate. Orders received and chart reviewed. Upon entry pt declining PT eval currently due to throat pain after ambulating with nursing earlier this morning. Agreeable to afternoon f/u.    Dorina HERO. Fairly IV, PT, DPT Physical Therapist- [place]  [facility] 08/16/2024, 10:58 AM

## 2024-08-16 NOTE — Progress Notes (Signed)
 [facility] SURGICAL PROGRESS NOTE  Hospital Day(s): 3.   Post op day(s): 3 Days Post-Op.   Interval History:  Patient seen and examined No acute events or new complaints overnight.  Patient reports he is doing okay; abdomen still sore expectedly No fever, chills, emesis Labs this morning are reassuring  Surgical drain with 85 ccs out; serous NGT in place; 1250 ccs He is on Zosyn  TPN  Vital signs in last 24 hours: [min-max] current  Temp:  [98.4 F (36.9 C)-99.4 F (37.4 C)] 99.4 F (37.4 C) (11/05 0507) Pulse Rate:  [57-66] 57 (11/05 0507) Resp:  [16-18] 18 (11/05 0507) BP: (131-141)/(75-83) 132/75 (11/05 0507) SpO2:  [97 %-99 %] 97 % (11/05 0507) Weight:  [57.2 kg] 57.2 kg (11/05 0507)     Height: 6' (182.9 cm) Weight: 57.2 kg BMI (Calculated): 17.1   Intake/Output last 2 shifts:  11/04 0701 - 11/05 0700 In: 571.8 [I.V.:431.7; IV Piggyback:140.2] Out: 1835 [Urine:500; Emesis/NG output:1250; Drains:85]   Physical Exam:  Constitutional: alert, cooperative and no distress  HEENT: NGT in place Respiratory: breathing non-labored at rest  Cardiovascular: regular rate and sinus rhythm  Gastrointestinal: soft, incisional soreness, and non-distended, no rebound/guarding. Surgical drain in place; output serous Integumentary: Laparoscopic incisions are CDI with dermabond   Labs:     Latest Ref Rng & Units 08/14/2024    5:28 AM 08/13/2024   11:25 AM 03/12/2019    8:05 AM  CBC  WBC 4.0 - 10.5 K/uL 16.7  7.4  13.4   Hemoglobin 13.0 - 17.0 g/dL 87.6  85.7  87.4   Hematocrit 39.0 - 52.0 % 36.2  41.8  34.8   Platelets 150 - 400 K/uL 196  306  151       Latest Ref Rng & Units 08/16/2024    5:03 AM 08/15/2024    6:58 AM 08/14/2024    5:28 AM  CMP  Glucose 70 - 99 mg/dL 881  94  891   BUN 6 - 20 mg/dL 21  27  23    Creatinine 0.61 - 1.24 mg/dL 9.14  8.98  9.09   Sodium 135 - 145 mmol/L 138  139  138   Potassium 3.5 - 5.1 mmol/L 3.8  4.0  4.4   Chloride 98 -  111 mmol/L 107  105  107   CO2 22 - 32 mmol/L 24  27  22    Calcium 8.9 - 10.3 mg/dL 7.8  7.8  7.8      Imaging studies: No new pertinent imaging studies   Assessment/Plan:  58 y.o. male 3 Days Post-Op s/p robotic assisted diagnostic laparoscopy and jejunal serosal patch repair of perforated gastric ulcer    - He will need to be STRICT NPO until 11/06. We will plan for UGI on this date to reassess repair; ordered.   - Continue TPN; monitor electrolytes  - Continue NGT decompression; LIS; monitor and record output   - Continue IV Abx (Zosyn)  - Monitor abdominal examination  - IV PPI BID - Monitor leukocytosis; likely reactive in nature  - Pain control prn; avoid NSAID - Antiemetics prn - Okay to mobilize; engage therapies if needed; can clamp NGT for mobilization - Further management per primary service; we will follow    All of the above findings and recommendations were discussed with the patient, and the medical team, and all of patient's questions were answered to his expressed satisfaction.  -- Arthea Platt, PA-C [facility] 08/16/2024, 7:26 AM  M-F: 7am - 4pm\

## 2024-08-17 ENCOUNTER — Inpatient Hospital Stay

## 2024-08-17 DIAGNOSIS — R100 Acute abdomen: Secondary | ICD-10-CM

## 2024-08-17 LAB — COMPREHENSIVE METABOLIC PANEL WITH GFR
ALT: 12 U/L (ref 0–44)
AST: 18 U/L (ref 15–41)
Albumin: 2.4 g/dL — ABNORMAL LOW (ref 3.5–5.0)
Alkaline Phosphatase: 56 U/L (ref 38–126)
Anion gap: 7 (ref 5–15)
BUN: 24 mg/dL — ABNORMAL HIGH (ref 6–20)
CO2: 26 mmol/L (ref 22–32)
Calcium: 8 mg/dL — ABNORMAL LOW (ref 8.9–10.3)
Chloride: 107 mmol/L (ref 98–111)
Creatinine, Ser: 0.84 mg/dL (ref 0.61–1.24)
GFR, Estimated: >60 mL/min (ref >60)
Glucose, Bld: 108 mg/dL — ABNORMAL HIGH (ref 70–99)
Potassium: 3.9 mmol/L (ref 3.5–5.1)
Sodium: 140 mmol/L (ref 135–145)
Total Bilirubin: 0.5 mg/dL (ref 0.0–1.2)
Total Protein: 5.8 g/dL — ABNORMAL LOW (ref 6.5–8.1)

## 2024-08-17 LAB — GLUCOSE, CAPILLARY
Glucose-Capillary: 138 mg/dL — ABNORMAL HIGH (ref 70–99)
Glucose-Capillary: 108 mg/dL — ABNORMAL HIGH (ref 70–99)
Glucose-Capillary: 131 mg/dL — ABNORMAL HIGH (ref 70–99)
Glucose-Capillary: 132 mg/dL — ABNORMAL HIGH (ref 70–99)

## 2024-08-17 LAB — PHOSPHORUS: Phosphorus: 3.8 mg/dL (ref 2.5–4.6)

## 2024-08-17 LAB — MAGNESIUM: Magnesium: 2 mg/dL (ref 1.7–2.4)

## 2024-08-17 MED ORDER — IOHEXOL 300 MG/ML  SOLN
150.0000 mL | Freq: Once | INTRAMUSCULAR | Status: AC | PRN
  Administered 2024-08-17: 150 mL via ORAL

## 2024-08-17 MED ORDER — TRACE MINERALS CU-MN-SE-ZN 300-55-60-3000 MCG/ML IV SOLN
INTRAVENOUS | Status: AC
  Filled 2024-08-17: qty 732

## 2024-08-17 NOTE — Progress Notes (Signed)
 Progress Note   Patient: David Sherman FMW:[id number] DOB: 12/07/1965 DOA: 08/13/2024     4 DOS: the patient was seen and examined on 08/17/2024     Brief hospital course:   David Sherman is a 58 y.o. male with history of hypertension, GERD, depression, anxiety, restless leg syndrome, chronic alcohol use with likely dependence, presents to the emergency department for chief concerns of abdominal pain. CT abdomen pelvis with contrast: Was read as pneumoperitoneum in the epigastric region, likely related to perforated distal gastric ulcer. Patient was taken emergently to the OR for acute abdomen in setting of the above.    Underwent jejunal patch by general surgery on 11/2.  Tolerated well.  Started on TPN postoperatively.  Strict NPO.     Assessment & Plan:   Principal Problem:   Perforated gastric ulcer (HCC) Active Problems:   Pneumoperitoneum   Alcohol abuse   Essential hypertension   COPD (chronic obstructive pulmonary disease) (HCC)   GERD (gastroesophageal reflux disease)   Protein-calorie malnutrition, severe  Alcohol abuse Continue CIWA   Pneumoperitoneum Secondary to possible perforated distal gastric ulcer General Surgery is aware and has taken patient emergently to the OR Patient now status post jejunal patch Plan: Continue strict NPO.   Continue on IV Zosyn.   Continue on TPN per surgery.     COPD (chronic obstructive pulmonary disease) (HCC) Not acutely exacerbated.  Continue as needed nebs   Essential hypertension Continue Home lisinopril Continue     Cavitary lung lesion  spiculated lesion noted on chest x-ray.  Follow-up chest CT with IV contrast demonstrates a cavitary lesion with thickened walls in the peripheral left upper lobe measuring 3.4 x 1.5 cm.  Recommend PET/CT and tissue sampling once patient is medically stable       DVT prophylaxis: SQ Lovenox  Code Status: Full Family Communication: None Disposition Plan: Status is:  Inpatient Remains inpatient appropriate because: Perforated gastric ulcer     Level of care: Med-Surg   Consultants:  Surgery   Procedures:  Ex lap   Subjective:  Patient denies any acute overnight event Still has high output via the NG tube Still continues to require TPN   Physical Exam:   General exam: Appears weak and deconditioned Respiratory system: Scattered crackles bilaterally.  Normal work of breathing.  Room air Cardiovascular system: S1-S2, RRR, no murmurs, no pedal edema Gastrointestinal system: Midline surgical incision, incision CDI.  Decreased bowel sounds Central nervous system: Alert and oriented. No focal neurological deficits. Extremities: Symmetric 5 x 5 power. Skin: No rashes, lesions or ulcers Psychiatry: Judgement and insight appear normal. Mood & affect appropriate.    Data reviewed:    Latest Ref Rng & Units 08/14/2024    5:28 AM 08/13/2024   11:25 AM 03/12/2019    8:05 AM  CBC  WBC 4.0 - 10.5 K/uL 16.7  7.4  13.4   Hemoglobin 13.0 - 17.0 g/dL 87.6  85.7  87.4   Hematocrit 39.0 - 52.0 % 36.2  41.8  34.8   Platelets 150 - 400 K/uL 196  306  151        Latest Ref Rng & Units 08/17/2024    5:14 AM 08/16/2024    5:03 AM 08/15/2024    6:58 AM  BMP  Glucose 70 - 99 mg/dL 891  881  94   BUN 6 - 20 mg/dL 24  21  27    Creatinine 0.61 - 1.24 mg/dL 9.15  9.14  8.98   Sodium  135 - 145 mmol/L 140  138  139   Potassium 3.5 - 5.1 mmol/L 3.9  3.8  4.0   Chloride 98 - 111 mmol/L 107  107  105   CO2 22 - 32 mmol/L 26  24  27    Calcium 8.9 - 10.3 mg/dL 8.0  7.8  7.8      Vitals:   08/16/24 1926 08/17/24 0340 08/17/24 0812 08/17/24 1520  BP: (!) 144/78 126/70 (!) 156/86 136/76  Pulse: (!) 56 (!) 58 (!) 58 (!) 51  Resp: 14 17 19 18   Temp: 98.4 F (36.9 C) 98.8 F (37.1 C) 98.4 F (36.9 C) 98.4 F (36.9 C)  TempSrc:      SpO2: 99% 97% 99% 99%  Weight:  55.4 kg    Height:         Author: Drue ONEIDA Potter, MD 08/17/2024 5:53 PM  For on call review  [email or web address].

## 2024-08-17 NOTE — Progress Notes (Signed)
 [facility] SURGICAL PROGRESS NOTE  Hospital Day(s): 4.   Post op day(s): 4 Days Post-Op.   Interval History:  Patient seen and examined No acute events or new complaints overnight.  Patient reports he is still sore; no significant change  No fever, chills, emesis Labs continue to remain reassuring Surgical drain with 325 ccs out; this is still serous  NGT in place; 600 ccs; this remains bilious  He is on Zosyn  NPO + TPN Plan for UGI today  Vital signs in last 24 hours: [min-max] current  Temp:  [98.3 F (36.8 C)-98.8 F (37.1 C)] 98.4 F (36.9 C) (11/06 0812) Pulse Rate:  [55-59] 58 (11/06 0812) Resp:  [14-19] 19 (11/06 0812) BP: (122-156)/(69-86) 156/86 (11/06 0812) SpO2:  [97 %-99 %] 99 % (11/06 0812) Weight:  [55.4 kg] 55.4 kg (11/06 0340)     Height: 6' (182.9 cm) Weight: 55.4 kg BMI (Calculated): 16.56   Intake/Output last 2 shifts:  11/05 0701 - 11/06 0700 In: 1350.6 [I.V.:1200.6; IV Piggyback:150] Out: 1125 [Urine:200; Emesis/NG output:600; Drains:325]   Physical Exam:  Constitutional: alert, cooperative and no distress  HEENT: NGT in place; output bilious  Respiratory: breathing non-labored at rest  Cardiovascular: regular rate and sinus rhythm  Gastrointestinal: soft, incisional soreness, and non-distended, no rebound/guarding. Surgical drain in place; output serous Integumentary: Laparoscopic incisions are CDI with dermabond   Labs:     Latest Ref Rng & Units 08/14/2024    5:28 AM 08/13/2024   11:25 AM 03/12/2019    8:05 AM  CBC  WBC 4.0 - 10.5 K/uL 16.7  7.4  13.4   Hemoglobin 13.0 - 17.0 g/dL 87.6  85.7  87.4   Hematocrit 39.0 - 52.0 % 36.2  41.8  34.8   Platelets 150 - 400 K/uL 196  306  151       Latest Ref Rng & Units 08/17/2024    5:14 AM 08/16/2024    5:03 AM 08/15/2024    6:58 AM  CMP  Glucose 70 - 99 mg/dL 891  881  94   BUN 6 - 20 mg/dL 24  21  27    Creatinine 0.61 - 1.24 mg/dL 9.15  9.14  8.98   Sodium 135 - 145 mmol/L  140  138  139   Potassium 3.5 - 5.1 mmol/L 3.9  3.8  4.0   Chloride 98 - 111 mmol/L 107  107  105   CO2 22 - 32 mmol/L 26  24  27    Calcium 8.9 - 10.3 mg/dL 8.0  7.8  7.8   Total Protein 6.5 - 8.1 g/dL 5.8     Total Bilirubin 0.0 - 1.2 mg/dL 0.5     Alkaline Phos 38 - 126 U/L 56     AST 15 - 41 U/L 18     ALT 0 - 44 U/L 12        Imaging studies: No new pertinent imaging studies   Assessment/Plan:  58 y.o. male 4 Days Post-Op s/p robotic assisted diagnostic laparoscopy and jejunal serosal patch repair of perforated gastric ulcer    - We will continue plan for strict NPO at this time; He is pending UGI today to reassess repair and ensure no leak.   - Continue TPN; monitor electrolytes  - Continue NGT decompression; LIS; monitor and record output - Even if UGI is reassuring, we may need to consider keeping NGT for now given high volume bilious output, no flatus   - Continue IV  Abx (Zosyn)  - Monitor abdominal examination  - IV PPI BID - Pain control prn; avoid NSAID - Antiemetics prn - Okay to mobilize; therapies on board; can clamp NGT for mobilization - Further management per primary service; we will follow    All of the above findings and recommendations were discussed with the patient, and the medical team, and all of patient's questions were answered to his expressed satisfaction.  -- Arthea Platt, PA-C [facility] 08/17/2024, 8:39 AM M-F: 7am - 4pm\

## 2024-08-17 NOTE — Plan of Care (Signed)

## 2024-08-17 NOTE — Plan of Care (Signed)
  Problem: Education: Goal: Knowledge of General Education information will improve Description: Including pain rating scale, medication(s)/side effects and non-pharmacologic comfort measures Outcome: Progressing   Problem: Health Behavior/Discharge Planning: Goal: Ability to manage health-related needs will improve Outcome: Progressing   Problem: Clinical Measurements: Goal: Will remain free from infection Outcome: Progressing   Problem: Clinical Measurements: Goal: Respiratory complications will improve Outcome: Progressing   Problem: Activity: Goal: Risk for activity intolerance will decrease Outcome: Progressing   Problem: Coping: Goal: Level of anxiety will decrease Outcome: Progressing   Problem: Pain Managment: Goal: General experience of comfort will improve and/or be controlled Outcome: Progressing   Problem: Skin Integrity: Goal: Risk for impaired skin integrity will decrease Outcome: Progressing

## 2024-08-17 NOTE — Progress Notes (Signed)
 PHARMACY - TOTAL PARENTERAL NUTRITION CONSULT NOTE   Indication: Prolonged ileus  Patient Measurements: Height: 6' (182.9 cm) Weight: 55.4 kg (122 lb 2.2 oz) IBW/kg (Calculated) : 77.6 TPN AdjBW (KG): 61.2 Body mass index is 16.56 kg/m.  Assessment:  58 y.o. male s/p robotic assisted diagnostic laparoscopy and jejunal serosal patch repair of perforated gastric ulcer 11/2.   Glucose / Insulin: SSI, BG: 100-130s 2 units insulin last 24 hours  Electrolytes: WNL Renal: SCr<1, stable Hepatic: LFTs wnl Intake / Output;  11/02 0701 - 11/03 0700 In: 3168.2 [I.V.:2896.2; IV Piggyback:272] Out: 735 [Urine:575; Drains:110]  GI Imaging: No new pertinent imaging studies  GI Surgeries / Procedures:  11/02 robotic assisted diagnostic laparoscopy and jejunal serosal patch repair of perforated gastric ulcer  ID:  11/2 Zosyn>> (Day5)   Central access: 11/3 TPN start date: 08/14/24   Nutritional Goals: Goal TPN rate is 75 mL/hr (provides 109.8 g of protein and 2200.7 kcals per day)  RD Assessment: Estimated Needs Total Energy Estimated Needs: 1900-2200kcal/day Total Protein Estimated Needs: 95-110g/day Total Fluid Estimated Needs: 1.9-2.2L/day  Current Nutrition:  NPO  Plan:  ---Continue TPN at goal rate of 59mL/hr at 1800  ---Electrolytes in TPN (standard): Na 50mEq/L, K 36mEq/L, Ca 19mEq/L, Mg 9mEq/L, and Phos 15mmol/L. Cl:Ac 1:1 ---Add standard MVI, folic acid  and trace elements to TPN --Thiamine  100mg  IV daily (outside TPN)  ---Initiate Sensitive q6h SSI and adjust as needed  ---Monitor TPN labs on Mon/Thurs, daily until stable  Estill CHRISTELLA Lutes, PharmD, BCPS Clinical Pharmacist 08/17/2024 9:06 AM

## 2024-08-17 NOTE — Plan of Care (Signed)
  Problem: Education: Goal: Knowledge of General Education information will improve Description: Including pain rating scale, medication(s)/side effects and non-pharmacologic comfort measures Outcome: Progressing   Problem: Clinical Measurements: Goal: Will remain free from infection Outcome: Progressing   Problem: Clinical Measurements: Goal: Respiratory complications will improve Outcome: Progressing   Problem: Pain Managment: Goal: General experience of comfort will improve and/or be controlled Outcome: Progressing   Problem: Safety: Goal: Ability to remain free from injury will improve Outcome: Progressing

## 2024-08-18 DIAGNOSIS — R100 Acute abdomen: Secondary | ICD-10-CM | POA: Diagnosis not present

## 2024-08-18 LAB — CBC WITH DIFFERENTIAL/PLATELET
Abs Immature Granulocytes: 0.01 K/uL (ref 0.00–0.07)
Basophils Absolute: 0 K/uL (ref 0.0–0.1)
Basophils Relative: 1 %
Eosinophils Absolute: 0.4 K/uL (ref 0.0–0.5)
Eosinophils Relative: 7 %
HCT: 32.7 % — ABNORMAL LOW (ref 39.0–52.0)
Hemoglobin: 11 g/dL — ABNORMAL LOW (ref 13.0–17.0)
Immature Granulocytes: 0 %
Lymphocytes Relative: 17 %
Lymphs Abs: 1 K/uL (ref 0.7–4.0)
MCH: 33.5 pg (ref 26.0–34.0)
MCHC: 33.6 g/dL (ref 30.0–36.0)
MCV: 99.7 fL (ref 80.0–100.0)
Monocytes Absolute: 0.8 K/uL (ref 0.1–1.0)
Monocytes Relative: 13 %
Neutro Abs: 3.7 K/uL (ref 1.7–7.7)
Neutrophils Relative %: 62 %
Platelets: 262 K/uL (ref 150–400)
RBC: 3.28 MIL/uL — ABNORMAL LOW (ref 4.22–5.81)
RDW: 11.8 % (ref 11.5–15.5)
WBC: 5.8 K/uL (ref 4.0–10.5)
nRBC: 0 % (ref 0.0–0.2)

## 2024-08-18 LAB — PHOSPHORUS: Phosphorus: 3.6 mg/dL (ref 2.5–4.6)

## 2024-08-18 LAB — BASIC METABOLIC PANEL WITH GFR
Anion gap: 5 (ref 5–15)
BUN: 29 mg/dL — ABNORMAL HIGH (ref 6–20)
CO2: 26 mmol/L (ref 22–32)
Calcium: 8 mg/dL — ABNORMAL LOW (ref 8.9–10.3)
Chloride: 111 mmol/L (ref 98–111)
Creatinine, Ser: 0.66 mg/dL (ref 0.61–1.24)
GFR, Estimated: >60 mL/min (ref >60)
Glucose, Bld: 108 mg/dL — ABNORMAL HIGH (ref 70–99)
Potassium: 3.9 mmol/L (ref 3.5–5.1)
Sodium: 142 mmol/L (ref 135–145)

## 2024-08-18 LAB — GLUCOSE, CAPILLARY
Glucose-Capillary: 130 mg/dL — ABNORMAL HIGH (ref 70–99)
Glucose-Capillary: 170 mg/dL — ABNORMAL HIGH (ref 70–99)
Glucose-Capillary: 103 mg/dL — ABNORMAL HIGH (ref 70–99)

## 2024-08-18 MED ORDER — PANTOPRAZOLE SODIUM 40 MG PO TBEC
40.0000 mg | DELAYED_RELEASE_TABLET | Freq: Two times a day (BID) | ORAL | Status: DC
  Administered 2024-08-18 – 2024-08-21 (×7): 40 mg via ORAL
  Filled 2024-08-18 (×7): qty 1

## 2024-08-18 MED ORDER — TRACE MINERALS CU-MN-SE-ZN 300-55-60-3000 MCG/ML IV SOLN
INTRAVENOUS | Status: AC
  Filled 2024-08-18: qty 732

## 2024-08-18 MED ORDER — BOOST / RESOURCE BREEZE PO LIQD CUSTOM
1.0000 | Freq: Three times a day (TID) | ORAL | Status: DC
  Administered 2024-08-18 – 2024-08-20 (×9): 1 via ORAL

## 2024-08-18 NOTE — Progress Notes (Signed)
 PHARMACY - TOTAL PARENTERAL NUTRITION CONSULT NOTE   Indication: Prolonged ileus  Patient Measurements: Height: 6' (182.9 cm) Weight: 55.3 kg (121 lb 14.6 oz) IBW/kg (Calculated) : 77.6 TPN AdjBW (KG): 61.2 Body mass index is 16.53 kg/m.  Assessment:  58 y.o. male s/p robotic assisted diagnostic laparoscopy and jejunal serosal patch repair of perforated gastric ulcer 11/2.   Glucose / Insulin: SSI, BG: 100-130s 2 units insulin last 24 hours  Electrolytes: WNL Renal: SCr<1, stable Hepatic: LFTs wnl Intake / Output;  11/02 0701 - 11/03 0700 In: 3168.2 [I.V.:2896.2; IV Piggyback:272] Out: 735 [Urine:575; Drains:110]  GI Imaging: No new pertinent imaging studies  GI Surgeries / Procedures:  11/02 robotic assisted diagnostic laparoscopy and jejunal serosal patch repair of perforated gastric ulcer  ID:  11/2 Zosyn>> (Day6)   Central access: 11/3 TPN start date: 08/14/24   Nutritional Goals: Goal TPN rate is 75 mL/hr (provides 109.8 g of protein and 2200.7 kcals per day)  RD Assessment: Estimated Needs Total Energy Estimated Needs: 1900-2200kcal/day Total Protein Estimated Needs: 95-110g/day Total Fluid Estimated Needs: 1.9-2.2L/day  Current Nutrition:  --Patient transitioning to PO meds and advancing to clear liquids today  Plan:  ---Continue TPN at goal rate of 105mL/hr at 1800  ---Electrolytes in TPN (standard): Na 50mEq/L, K 3mEq/L, Ca 73mEq/L, Mg 24mEq/L, and Phos 15mmol/L. Cl:Ac 1:1 ---Add standard MVI, folic acid  and trace elements to TPN --Thiamine  100mg  IV daily (outside TPN)  ---Initiate Sensitive q6h SSI and adjust as needed  ---Monitor TPN labs on Mon/Thurs, daily until stable  Estill CHRISTELLA Lutes, PharmD, BCPS Clinical Pharmacist 08/18/2024 10:14 AM

## 2024-08-18 NOTE — Progress Notes (Signed)
 Nutrition Follow-up  DOCUMENTATION CODES:   Severe malnutrition in context of chronic illness  INTERVENTION:   -TPN management per pharmacy -Continue 1 mg folic acid  daily -Continue 100 mg thiamine  daily -Monitor Mg, K, and Phos and replete as needed -Continue daily weights  -Continue clear liquid diet; RD will monitor diet advancement -Continue Boost Breeze po TID, each supplement provides 250 kcal and 9 grams of protein   NUTRITION DIAGNOSIS:   Severe Malnutrition related to chronic illness (COPD, etoh abuse) as evidenced by severe fat depletion, severe muscle depletion.  Ongoing  GOAL:   Patient will meet greater than or equal to 90% of their needs  Met with TPN  MONITOR:   Diet advancement, Labs, Weight trends, Skin, I & O's, Other (Comment) (TPN)  REASON FOR ASSESSMENT:   Consult New TPN/TNA  ASSESSMENT:   58 y/o male with h/o anxiety, depression, tremor, substance abuse, etoh abuse, DDD, chronic pain, inguinal hernia repair, HTN, GERD and COPD who is admitted with perforated prepyloric gastric ulcer now s/p diagnostic laparoscopy with robotic jejunal serosal patch repair 11/2.  11/2- s/p Procedure: Diagnostic laparoscopy, robotic jejunal serosal patch repair of perforated gastric ulcer  11/3- PICC placed, TPN initiated 11/6- s/p UGI- no leak 11/7- NGT d/cadvanced to clear liquid diet  Reviewed I/O's: -199 ml x 24 hours and +871 ml since admission  UOP: 485 ml x 24 hours   NGT output: 1.5 L x 24 hours  Drain output: 37 ml x 24 hours  Patient sleeping soundly at time of visit. He did not respond to name being called.   Upper GI on 08/17/24 revealed no leak. NGT was left in at that time due to bilious output. NGT removed this AM and advanced to clear liquids secondary to bowel movement.   Patient receiving TPN for sole source nutrition at 75 ml/hr, which provides 2201 kcals and 110 grams protein, meeting 100% of estimated needs.   Medications reviewed and  include protonix  and thiamine .   Labs reviewed: CBGS: 108-138 (inpatient orders for glycemic control are 0-9 units insulin aspart every 4 hours).    Diet Order:   Diet Order             Diet clear liquid Room service appropriate? Yes; Fluid consistency: Thin  Diet effective now                   EDUCATION NEEDS:   Education needs have been addressed  Skin:  Skin Assessment: Skin Integrity Issues: Skin Integrity Issues:: Incisions Incisions: ulbilicus and abdomen  Last BM:  08/17/24  Height:   Ht Readings from Last 1 Encounters:  08/13/24 6' (1.829 m)    Weight:   Wt Readings from Last 1 Encounters:  08/18/24 55.3 kg    Ideal Body Weight:  80.9 kg  BMI:  Body mass index is 16.53 kg/m.  Estimated Nutritional Needs:   Kcal:  1900-2200kcal/day  Protein:  95-110g/day  Fluid:  1.9-2.2L/day    Margery ORN, RD, LDN, CDCES Registered Dietitian III Certified Diabetes Care and Education Specialist If unable to reach this RD, please use RD Inpatient group chat on secure chat between hours of 8am-4 pm daily

## 2024-08-18 NOTE — Progress Notes (Signed)
 Mobility Specialist - Progress Note    08/18/24 1100  Mobility  Activity Ambulated with assistance;Stood at bedside  Level of Assistance Independent after set-up  Assistive Device  (IV Stand)  Distance Ambulated (ft) 170 ft  Range of Motion/Exercises All extremities  Activity Response Tolerated well  Mobility visit 1 Mobility  Mobility Specialist Start Time (ACUTE ONLY) 1047  Mobility Specialist Stop Time (ACUTE ONLY) 1100  Mobility Specialist Time Calculation (min) (ACUTE ONLY) 13 min   Pt was supine in bed with the HOB elevated on RA. Pt agreed to mobility. Pt is able today to get to the EOB independently with no bed features. Pt is able today to STS independently with no AD. Pt was receiving fluids by IV. Pt ambulated well. Pt didn't need a recovery break throughout activity. After activity pt returned to room back in bed with needs in reach.  Clem Rodes Mobility Specialist 08/18/24, 11:47 AM

## 2024-08-18 NOTE — Progress Notes (Signed)
 [facility] SURGICAL PROGRESS NOTE  Hospital Day(s): 5.   Post op day(s): 5 Days Post-Op.   Interval History:  Patient seen and examined + BM Missing Dog Pain controlled   Vital signs in last 24 hours: [min-max] current  Temp:  [97.8 F (36.6 C)-98.4 F (36.9 C)] 97.9 F (36.6 C) (11/07 0726) Pulse Rate:  [49-51] 49 (11/07 0726) Resp:  [17-18] 18 (11/07 0726) BP: (127-150)/(60-80) 150/80 (11/07 0726) SpO2:  [98 %-100 %] 100 % (11/07 0726) Weight:  [55.3 kg] 55.3 kg (11/07 0407)     Height: 6' (182.9 cm) Weight: 55.3 kg BMI (Calculated): 16.53   Intake/Output last 2 shifts:  11/06 0701 - 11/07 0700 In: 1823.6 [I.V.:1676.2; IV Piggyback:147.3] Out: 2022 [Urine:485; Emesis/NG output:1500; Drains:37]   Physical Exam:  Constitutional: alert, cooperative and no distress  HEENT: NGT in place; output bilious  Respiratory: breathing non-labored at rest  Cardiovascular: regular rate and sinus rhythm  Gastrointestinal: soft, incisional soreness, and non-distended, no rebound/guarding. Surgical drain in place; output serous Integumentary: Laparoscopic incisions are CDI with dermabond   Labs:     Latest Ref Rng & Units 08/18/2024    5:55 AM 08/14/2024    5:28 AM 08/13/2024   11:25 AM  CBC  WBC 4.0 - 10.5 K/uL 5.8  16.7  7.4   Hemoglobin 13.0 - 17.0 g/dL 88.9  87.6  85.7   Hematocrit 39.0 - 52.0 % 32.7  36.2  41.8   Platelets 150 - 400 K/uL 262  196  306       Latest Ref Rng & Units 08/18/2024    5:55 AM 08/17/2024    5:14 AM 08/16/2024    5:03 AM  CMP  Glucose 70 - 99 mg/dL 891  891  881   BUN 6 - 20 mg/dL 29  24  21    Creatinine 0.61 - 1.24 mg/dL 9.33  9.15  9.14   Sodium 135 - 145 mmol/L 142  140  138   Potassium 3.5 - 5.1 mmol/L 3.9  3.9  3.8   Chloride 98 - 111 mmol/L 111  107  107   CO2 22 - 32 mmol/L 26  26  24    Calcium 8.9 - 10.3 mg/dL 8.0  8.0  7.8   Total Protein 6.5 - 8.1 g/dL  5.8    Total Bilirubin 0.0 - 1.2 mg/dL  0.5    Alkaline Phos 38 -  126 U/L  56    AST 15 - 41 U/L  18    ALT 0 - 44 U/L  12       Imaging studies: Upper GI study 11/6, no evidence of leak    Assessment/Plan:  58 y.o. male 5 Days Post-Op s/p robotic assisted diagnostic laparoscopy and jejunal serosal patch repair of perforated gastric ulcer    - Having BM, ok to advance to cld and remove NGT, it is acceptable for transition to PO meds for him as well  - Continue TPN; monitor electrolytes  - Continue IV Abx (Zosyn), can stop after today   - Monitor abdominal examination  - IV PPI BID--> PO - Pain control prn; avoid NSAID - Antiemetics prn - Okay to mobilize; therapies on board - Further management per primary service; we will follow     -- Jayson MALVA Endow

## 2024-08-18 NOTE — Progress Notes (Signed)
 Pt refused bed alarm but was educated about its importance. Pt verbalized understanding. Will notify incoming shift.

## 2024-08-18 NOTE — Progress Notes (Signed)
 Progress Note   Patient: David Sherman FMW:[id number] DOB: 03-30-1966 DOA: 08/13/2024     5 DOS: the patient was seen and examined on 08/18/2024      Brief hospital course:   David Sherman is a 58 y.o. male with history of hypertension, GERD, depression, anxiety, restless leg syndrome, chronic alcohol use with likely dependence, presents to the emergency department for chief concerns of abdominal pain. CT abdomen pelvis with contrast: Was read as pneumoperitoneum in the epigastric region, likely related to perforated distal gastric ulcer. Patient was taken emergently to the OR for acute abdomen in setting of the above.    Underwent jejunal patch by general surgery on 11/2.  Tolerated well.  Started on TPN postoperatively.  Strict NPO.     Assessment & Plan:   Principal Problem:   Perforated gastric ulcer (HCC) Active Problems:   Pneumoperitoneum   Alcohol abuse   Essential hypertension   COPD (chronic obstructive pulmonary disease) (HCC)   GERD (gastroesophageal reflux disease)   Protein-calorie malnutrition, severe  Alcohol abuse Continue CIWA   Pneumoperitoneum Secondary to possible perforated distal gastric ulcer General Surgery is aware and has taken patient emergently to the OR Patient now status post jejunal patch Plan: NG tube removed on 08/18/2024 Diet advanced by general surgeon Continue on IV Zosyn.   Continue on TPN per surgery.     COPD (chronic obstructive pulmonary disease) (HCC) Not acutely exacerbated.  Continue as needed nebs   Essential hypertension Continue Home lisinopril Continue     Cavitary lung lesion  spiculated lesion noted on chest x-ray.  Follow-up chest CT with IV contrast demonstrates a cavitary lesion with thickened walls in the peripheral left upper lobe measuring 3.4 x 1.5 cm.  Recommend PET/CT and tissue sampling once patient is medically stable       DVT prophylaxis: SQ Lovenox  Code Status: Full Family  Communication: None Disposition Plan: Status is: Inpatient Remains inpatient appropriate because: Perforated gastric ulcer     Level of care: Med-Surg   Consultants:  Surgery   Procedures:  Ex lap   Subjective:  Patient denies any acute overnight event NG tube removed today Diet advanced by general surgeon Still continues to require TPN   Physical Exam:   General exam: Appears weak and deconditioned Respiratory system: Scattered crackles bilaterally.  Normal work of breathing.  Room air Cardiovascular system: S1-S2, RRR, no murmurs, no pedal edema Gastrointestinal system: Midline surgical incision, incision CDI.  Decreased bowel sounds Central nervous system: Alert and oriented. No focal neurological deficits. Extremities: Symmetric 5 x 5 power. Skin: No rashes, lesions or ulcers Psychiatry: Judgement and insight appear normal. Mood & affect appropriate.    Data reviewed:     Latest Ref Rng & Units 08/18/2024    5:55 AM 08/14/2024    5:28 AM 08/13/2024   11:25 AM  CBC  WBC 4.0 - 10.5 K/uL 5.8  16.7  7.4   Hemoglobin 13.0 - 17.0 g/dL 88.9  87.6  85.7   Hematocrit 39.0 - 52.0 % 32.7  36.2  41.8   Platelets 150 - 400 K/uL 262  196  306        Latest Ref Rng & Units 08/18/2024    5:55 AM 08/17/2024    5:14 AM 08/16/2024    5:03 AM  BMP  Glucose 70 - 99 mg/dL 891  891  881   BUN 6 - 20 mg/dL 29  24  21    Creatinine 0.61 - 1.24 mg/dL  0.66  0.84  0.85   Sodium 135 - 145 mmol/L 142  140  138   Potassium 3.5 - 5.1 mmol/L 3.9  3.9  3.8   Chloride 98 - 111 mmol/L 111  107  107   CO2 22 - 32 mmol/L 26  26  24    Calcium 8.9 - 10.3 mg/dL 8.0  8.0  7.8      Vitals:   08/18/24 0407 08/18/24 0726 08/18/24 1503 08/18/24 1708  BP: 135/60 (!) 150/80 (!) 148/82 126/75  Pulse: (!) 51 (!) 49 (!) 52 (!) 50  Resp: 17 18  16   Temp: 97.8 F (36.6 C) 97.9 F (36.6 C)  97.9 F (36.6 C)  TempSrc: Oral     SpO2: 99% 100%  99%  Weight: 55.3 kg     Height:        Author: Drue ONEIDA Potter, MD 08/18/2024 5:45 PM  For on call review [email or web address].

## 2024-08-18 NOTE — Plan of Care (Signed)

## 2024-08-19 DIAGNOSIS — R100 Acute abdomen: Secondary | ICD-10-CM | POA: Diagnosis not present

## 2024-08-19 LAB — CBC WITH DIFFERENTIAL/PLATELET
Abs Immature Granulocytes: 0.03 K/uL (ref 0.00–0.07)
Basophils Absolute: 0.1 K/uL (ref 0.0–0.1)
Basophils Relative: 1 %
Eosinophils Absolute: 0.4 K/uL (ref 0.0–0.5)
Eosinophils Relative: 6 %
HCT: 33.4 % — ABNORMAL LOW (ref 39.0–52.0)
Hemoglobin: 11.4 g/dL — ABNORMAL LOW (ref 13.0–17.0)
Immature Granulocytes: 0 %
Lymphocytes Relative: 22 %
Lymphs Abs: 1.5 K/uL (ref 0.7–4.0)
MCH: 34 pg (ref 26.0–34.0)
MCHC: 34.1 g/dL (ref 30.0–36.0)
MCV: 99.7 fL (ref 80.0–100.0)
Monocytes Absolute: 0.9 K/uL (ref 0.1–1.0)
Monocytes Relative: 13 %
Neutro Abs: 4.1 K/uL (ref 1.7–7.7)
Neutrophils Relative %: 58 %
Platelets: 255 K/uL (ref 150–400)
RBC: 3.35 MIL/uL — ABNORMAL LOW (ref 4.22–5.81)
RDW: 11.7 % (ref 11.5–15.5)
WBC: 7 K/uL (ref 4.0–10.5)
nRBC: 0 % (ref 0.0–0.2)

## 2024-08-19 LAB — BASIC METABOLIC PANEL WITH GFR
Anion gap: 7 (ref 5–15)
BUN: 22 mg/dL — ABNORMAL HIGH (ref 6–20)
CO2: 21 mmol/L — ABNORMAL LOW (ref 22–32)
Calcium: 8.1 mg/dL — ABNORMAL LOW (ref 8.9–10.3)
Chloride: 110 mmol/L (ref 98–111)
Creatinine, Ser: 0.66 mg/dL (ref 0.61–1.24)
GFR, Estimated: >60 mL/min (ref >60)
Glucose, Bld: 106 mg/dL — ABNORMAL HIGH (ref 70–99)
Potassium: 4.3 mmol/L (ref 3.5–5.1)
Sodium: 138 mmol/L (ref 135–145)

## 2024-08-19 LAB — GLUCOSE, CAPILLARY
Glucose-Capillary: 109 mg/dL — ABNORMAL HIGH (ref 70–99)
Glucose-Capillary: 113 mg/dL — ABNORMAL HIGH (ref 70–99)
Glucose-Capillary: 127 mg/dL — ABNORMAL HIGH (ref 70–99)
Glucose-Capillary: 133 mg/dL — ABNORMAL HIGH (ref 70–99)

## 2024-08-19 MED ORDER — TRACE MINERALS CU-MN-SE-ZN 300-55-60-3000 MCG/ML IV SOLN
INTRAVENOUS | Status: AC
  Filled 2024-08-19: qty 390.4

## 2024-08-19 NOTE — Progress Notes (Signed)
 CC: pod # 6 Subjective: He is doing okay.  He is difficult to read.  He seems to be cold he was covered on his blankets He was able to carry conversation.  Had some p.o. intake but not 100% Jp 30cc Completed a/bs Objective: Vital signs in last 24 hours: Temp:  [97.5 F (36.4 C)-98.1 F (36.7 C)] 97.5 F (36.4 C) (11/08 0830) Pulse Rate:  [49-63] 49 (11/08 0830) Resp:  [16-18] 17 (11/08 0830) BP: (126-148)/(66-89) 135/70 (11/08 0830) SpO2:  [99 %-100 %] 100 % (11/08 0830) Weight:  [57.1 kg] 57.1 kg (11/08 0520) Last BM Date : 08/18/24  Intake/Output from previous day: 11/07 0701 - 11/08 0700 In: 1811.9 [P.O.:90; I.V.:1721.9] Out: 330 [Urine:300; Drains:30] Intake/Output this shift: No intake/output data recorded.  Physical exam:  Anxious in no acute distress alert able to carry conversation Abd: soft, incisions c/d/I no infection or peritonitis Serous output   Lab Results: CBC  Recent Labs    08/18/24 0555 08/19/24 0612  WBC 5.8 7.0  HGB 11.0* 11.4*  HCT 32.7* 33.4*  PLT 262 255   BMET Recent Labs    08/18/24 0555 08/19/24 0612  NA 142 138  K 3.9 4.3  CL 111 110  CO2 26 21*  GLUCOSE 108* 106*  BUN 29* 22*  CREATININE 0.66 0.66  CALCIUM 8.0* 8.1*   PT/INR No results for input(s): LABPROT, INR in the last 72 hours. ABG No results for input(s): PHART, HCO3 in the last 72 hours.  Invalid input(s): PCO2, PO2  Studies/Results: No results found.  Anti-infectives: Anti-infectives (From admission, onward)    Start     Dose/Rate Route Frequency Ordered Stop   08/13/24 2200  piperacillin-tazobactam (ZOSYN) IVPB 3.375 g        3.375 g 12.5 mL/hr over 240 Minutes Intravenous Every 8 hours 08/13/24 1551 08/18/24 2359   08/13/24 1530  fluconazole (DIFLUCAN) IVPB 400 mg  Status:  Discontinued        400 mg 100 mL/hr over 120 Minutes Intravenous  Once 08/13/24 1428 08/18/24 1018   08/13/24 1345  piperacillin-tazobactam (ZOSYN) IVPB 3.375 g         3.375 g 100 mL/hr over 30 Minutes Intravenous  Once 08/13/24 1336 08/13/24 1428       Assessment/Plan:  Right gastric ulcer status post repair and serosal patch.  No evidence of leaks.  May liberalize diet.  Do think that we may have to wean him off the TPN and he is not quite ready to meet nutritional demands by p.o. intake.  He is difficult to read and on my clinical experience he seems to be DTing although it is difficult to assess whether this is baseline anxiety versus true delirium tremens. I Do not think he is quite ready for discharge today.   Laneta Luna, MD, [facility]  08/19/2024

## 2024-08-19 NOTE — Plan of Care (Signed)

## 2024-08-19 NOTE — Progress Notes (Signed)
 Progress Note   Patient: David Sherman FMW:[id number] DOB: [birth year] DOA: 08/13/2024     6 DOS: the patient was seen and examined on 08/19/2024       Brief hospital course:   GRANVILLE WHITEFIELD is a 58 y.o. male with history of hypertension, GERD, depression, anxiety, restless leg syndrome, chronic alcohol use with likely dependence, presents to the emergency department for chief concerns of abdominal pain. CT abdomen pelvis with contrast: Was read as pneumoperitoneum in the epigastric region, likely related to perforated distal gastric ulcer. Patient was taken emergently to the OR for acute abdomen in setting of the above.    Underwent jejunal patch by general surgery on 11/2.  Tolerated well.  Started on TPN postoperatively.  Strict NPO.     Assessment & Plan:   Principal Problem:   Perforated gastric ulcer (HCC) Active Problems:   Pneumoperitoneum   Alcohol abuse   Essential hypertension   COPD (chronic obstructive pulmonary disease) (HCC)   GERD (gastroesophageal reflux disease)   Protein-calorie malnutrition, severe  Alcohol abuse Continue CIWA   Pneumoperitoneum Secondary to possible perforated distal gastric ulcer General Surgery is aware and has taken patient emergently to the OR Patient now status post jejunal patch Plan: NG tube removed on 08/18/2024 Diet advanced by general surgeon Continue on IV Zosyn.   Continue on TPN per surgery.     COPD (chronic obstructive pulmonary disease) (HCC) Not acutely exacerbated.  Continue as needed nebs   Essential hypertension Continue Home lisinopril Continue     Cavitary lung lesion  spiculated lesion noted on chest x-ray.  Follow-up chest CT with IV contrast demonstrates a cavitary lesion with thickened walls in the peripheral left upper lobe measuring 3.4 x 1.5 cm.  Recommend PET/CT and tissue sampling once patient is medically stable       DVT prophylaxis: SQ Lovenox  Code Status: Full Family  Communication: None Disposition Plan: Status is: Inpatient Remains inpatient appropriate because: Perforated gastric ulcer     Level of care: Med-Surg   Consultants:  Surgery   Procedures:  Ex lap   Subjective:  No acute overnight events Diet advanced by general surgeon Still continues to require TPN   Physical Exam:   General exam: Appears weak and deconditioned Respiratory system: Scattered crackles bilaterally.  Normal work of breathing.  Room air Cardiovascular system: S1-S2, RRR, no murmurs, no pedal edema Gastrointestinal system: Midline surgical incision, incision CDI.  Decreased bowel sounds Central nervous system: Alert and oriented. No focal neurological deficits. Extremities: Symmetric 5 x 5 power. Skin: No rashes, lesions or ulcers Psychiatry: Judgement and insight appear normal. Mood & affect appropriate.    Data reviewed:    Latest Ref Rng & Units 08/19/2024    6:12 AM 08/18/2024    5:55 AM 08/14/2024    5:28 AM  CBC  WBC 4.0 - 10.5 K/uL 7.0  5.8  16.7   Hemoglobin 13.0 - 17.0 g/dL 88.5  88.9  87.6   Hematocrit 39.0 - 52.0 % 33.4  32.7  36.2   Platelets 150 - 400 K/uL 255  262  196        Latest Ref Rng & Units 08/19/2024    6:12 AM 08/18/2024    5:55 AM 08/17/2024    5:14 AM  BMP  Glucose 70 - 99 mg/dL 893  891  891   BUN 6 - 20 mg/dL 22  29  24    Creatinine 0.61 - 1.24 mg/dL 9.33  9.33  9.15  Sodium 135 - 145 mmol/L 138  142  140   Potassium 3.5 - 5.1 mmol/L 4.3  3.9  3.9   Chloride 98 - 111 mmol/L 110  111  107   CO2 22 - 32 mmol/L 21  26  26    Calcium 8.9 - 10.3 mg/dL 8.1  8.0  8.0      Vitals:   08/18/24 1708 08/18/24 2042 08/19/24 0520 08/19/24 0830  BP: 126/75 (!) 142/89 130/66 135/70  Pulse: (!) 50 63 (!) 51 (!) 49  Resp: 16 18 18 17   Temp: 97.9 F (36.6 C) 98.1 F (36.7 C) 98.1 F (36.7 C) (!) 97.5 F (36.4 C)  TempSrc:      SpO2: 99% 100% 100% 100%  Weight:   57.1 kg   Height:         Author: Drue ONEIDA Potter, MD 08/19/2024  9:59 AM  For on call review [email or web address].

## 2024-08-19 NOTE — Progress Notes (Signed)
 PHARMACY - TOTAL PARENTERAL NUTRITION CONSULT NOTE   Indication: Prolonged ileus  Patient Measurements: Height: 6' (182.9 cm) Weight: 57.1 kg (125 lb 14.1 oz) IBW/kg (Calculated) : 77.6 TPN AdjBW (KG): 61.2 Body mass index is 17.07 kg/m.  Assessment:  58 y.o. male s/p robotic assisted diagnostic laparoscopy and jejunal serosal patch repair of perforated gastric ulcer 11/2.   Glucose / Insulin: SSI, BG: 103-170 2 units insulin last 24 hours  Electrolytes: WNL Renal: SCr<1, stable Hepatic: LFTs wnl Intake / Output;  11/02 0701 - 11/03 0700 In: 3168.2 [I.V.:2896.2; IV Piggyback:272] Out: 735 [Urine:575; Drains:110]  GI Imaging: No new pertinent imaging studies  GI Surgeries / Procedures:  11/02 robotic assisted diagnostic laparoscopy and jejunal serosal patch repair of perforated gastric ulcer  ID:  11/2 Zosyn>> (Day6)   Central access: 11/3 TPN start date: 08/14/24   Nutritional Goals: Goal TPN rate is 75 mL/hr (provides 109.8 g of protein and 2200.7 kcals per day)  RD Assessment: Estimated Needs Total Energy Estimated Needs: 1900-2200kcal/day Total Protein Estimated Needs: 95-110g/day Total Fluid Estimated Needs: 1.9-2.2L/day  Current Nutrition:  --Patient transitioning to PO meds and advancing to Full liquids 11/8  Plan:  ---Decrease TPN to half-rate of 40 mL/hr at 1800 today then discontinue when bag finishes on 11/9, per discussion with surgeon. ---Electrolytes in TPN (standard): Na 50mEq/L, K 43mEq/L, Ca 47mEq/L, Mg 45mEq/L, and Phos 15mmol/L. Cl:Ac 1:1 ---Add standard MVI, folic acid  and trace elements to TPN --Thiamine  100mg  IV daily (outside TPN)  ---Initiate Sensitive q6h SSI and adjust as needed  ---Monitor TPN labs on Mon/Thurs, daily until stable  Makenleigh Crownover A, PharmD Clinical Pharmacist 08/19/2024 11:04 AM

## 2024-08-20 DIAGNOSIS — R100 Acute abdomen: Secondary | ICD-10-CM | POA: Diagnosis not present

## 2024-08-20 LAB — BASIC METABOLIC PANEL WITH GFR
Anion gap: 8 (ref 5–15)
BUN: 24 mg/dL — ABNORMAL HIGH (ref 6–20)
CO2: 24 mmol/L (ref 22–32)
Calcium: 8.3 mg/dL — ABNORMAL LOW (ref 8.9–10.3)
Chloride: 106 mmol/L (ref 98–111)
Creatinine, Ser: 0.74 mg/dL (ref 0.61–1.24)
GFR, Estimated: >60 mL/min (ref >60)
Glucose, Bld: 98 mg/dL (ref 70–99)
Potassium: 4.5 mmol/L (ref 3.5–5.1)
Sodium: 138 mmol/L (ref 135–145)

## 2024-08-20 LAB — CBC WITH DIFFERENTIAL/PLATELET
Abs Immature Granulocytes: 0.04 K/uL (ref 0.00–0.07)
Basophils Absolute: 0.1 K/uL (ref 0.0–0.1)
Basophils Relative: 1 %
Eosinophils Absolute: 0.5 K/uL (ref 0.0–0.5)
Eosinophils Relative: 6 %
HCT: 33.4 % — ABNORMAL LOW (ref 39.0–52.0)
Hemoglobin: 11.3 g/dL — ABNORMAL LOW (ref 13.0–17.0)
Immature Granulocytes: 1 %
Lymphocytes Relative: 25 %
Lymphs Abs: 1.9 K/uL (ref 0.7–4.0)
MCH: 33.4 pg (ref 26.0–34.0)
MCHC: 33.8 g/dL (ref 30.0–36.0)
MCV: 98.8 fL (ref 80.0–100.0)
Monocytes Absolute: 0.8 K/uL (ref 0.1–1.0)
Monocytes Relative: 11 %
Neutro Abs: 4.3 K/uL (ref 1.7–7.7)
Neutrophils Relative %: 56 %
Platelets: 322 K/uL (ref 150–400)
RBC: 3.38 MIL/uL — ABNORMAL LOW (ref 4.22–5.81)
RDW: 11.9 % (ref 11.5–15.5)
WBC: 7.6 K/uL (ref 4.0–10.5)
nRBC: 0 % (ref 0.0–0.2)

## 2024-08-20 LAB — GLUCOSE, CAPILLARY
Glucose-Capillary: 109 mg/dL — ABNORMAL HIGH (ref 70–99)
Glucose-Capillary: 115 mg/dL — ABNORMAL HIGH (ref 70–99)
Glucose-Capillary: 158 mg/dL — ABNORMAL HIGH (ref 70–99)
Glucose-Capillary: 69 mg/dL — ABNORMAL LOW (ref 70–99)
Glucose-Capillary: 81 mg/dL (ref 70–99)
Glucose-Capillary: 91 mg/dL (ref 70–99)

## 2024-08-20 NOTE — Progress Notes (Signed)
 Progress Note   Patient: David Sherman FMW:[id number] DOB: January 08, 1966 DOA: 08/13/2024     7 DOS: the patient was seen and examined on 08/20/2024    Brief hospital course:   David Sherman is a 58 y.o. male with history of hypertension, GERD, depression, anxiety, restless leg syndrome, chronic alcohol use with likely dependence, presents to the emergency department for chief concerns of abdominal pain. CT abdomen pelvis with contrast: Was read as pneumoperitoneum in the epigastric region, likely related to perforated distal gastric ulcer. Patient was taken emergently to the OR for acute abdomen in setting of the above.    Underwent jejunal patch by general surgery on 11/2.  Tolerated well.  Started on TPN postoperatively.       Assessment & Plan:   Principal Problem:   Perforated gastric ulcer (HCC) Active Problems:   Pneumoperitoneum   Alcohol abuse   Essential hypertension   COPD (chronic obstructive pulmonary disease) (HCC)   GERD (gastroesophageal reflux disease)   Protein-calorie malnutrition, severe  Alcohol abuse Continue CIWA   Pneumoperitoneum Secondary to possible perforated distal gastric ulcer General Surgery is aware and has taken patient emergently to the OR Patient now status post jejunal patch Plan: NG tube removed on 08/18/2024 Diet advanced by general surgeon Patient received a course of IV Zosyn Continue to wean off TPN per surgeon recommendation Advancement of diet as tolerated   COPD (chronic obstructive pulmonary disease) (HCC) Not acutely exacerbated.  Continue as needed nebs   Essential hypertension Continue Home lisinopril Continue     Cavitary lung lesion  spiculated lesion noted on chest x-ray.  Follow-up chest CT with IV contrast demonstrates a cavitary lesion with thickened walls in the peripheral left upper lobe measuring 3.4 x 1.5 cm.  Recommend PET/CT and tissue sampling once patient is medically stable     DVT  prophylaxis: SQ Lovenox  Code Status: Full Family Communication: None Disposition Plan: Status is: Inpatient Remains inpatient appropriate because: Perforated gastric ulcer     Level of care: Med-Surg   Consultants:  Surgery   Procedures:  Ex lap   Subjective:  No acute overnight events Diet advanced by general surgeon Patient still on TPN Surgeon hoping for possible discharge tomorrow    Physical Exam:   General exam: Appears weak and deconditioned Respiratory system: Scattered crackles bilaterally.  Normal work of breathing.  Room air Cardiovascular system: S1-S2, RRR, no murmurs, no pedal edema Gastrointestinal system: Midline surgical incision, incision with a drain in place Central nervous system: Alert and oriented. No focal neurological deficits. Extremities: Symmetric 5 x 5 power. Skin: No rashes, lesions or ulcers Psychiatry: Judgement and insight appear normal. Mood & affect appropriate.    Data reviewed:    Latest Ref Rng & Units 08/20/2024    8:24 AM 08/19/2024    6:12 AM 08/18/2024    5:55 AM  CBC  WBC 4.0 - 10.5 K/uL 7.6  7.0  5.8   Hemoglobin 13.0 - 17.0 g/dL 88.6  88.5  88.9   Hematocrit 39.0 - 52.0 % 33.4  33.4  32.7   Platelets 150 - 400 K/uL 322  255  262        Latest Ref Rng & Units 08/20/2024    8:24 AM 08/19/2024    6:12 AM 08/18/2024    5:55 AM  BMP  Glucose 70 - 99 mg/dL 98  893  891   BUN 6 - 20 mg/dL 24  22  29    Creatinine 0.61 -  1.24 mg/dL 9.25  9.33  9.33   Sodium 135 - 145 mmol/L 138  138  142   Potassium 3.5 - 5.1 mmol/L 4.5  4.3  3.9   Chloride 98 - 111 mmol/L 106  110  111   CO2 22 - 32 mmol/L 24  21  26    Calcium 8.9 - 10.3 mg/dL 8.3  8.1  8.0       Vitals:   08/19/24 1950 08/20/24 0351 08/20/24 0714 08/20/24 1507  BP: 113/73 123/72 127/70 123/73  Pulse: (!) 51 (!) 52 (!) 54 (!) 55  Resp: 18 14 17 19   Temp: 98.3 F (36.8 C) 98 F (36.7 C) 98.3 F (36.8 C) 98.4 F (36.9 C)  TempSrc: Oral     SpO2: 99% 99% 99% 99%   Weight:      Height:         Author: Drue ONEIDA Potter, MD 08/20/2024 4:26 PM  For on call review [email or web address].

## 2024-08-20 NOTE — Progress Notes (Signed)
 CC: pod # 7 Subjective:  He is doing okay.  He is difficult to read.  He seems to be cold he was covered on his blankets He was able to carry conversation, watching the football game  Improving p.o. intake  TPN to stop today  Jp 50cc Completed a/bs   Objective: Vital signs in last 24 hours: Temp:  [98 F (36.7 C)-98.3 F (36.8 C)] 98.3 F (36.8 C) (11/09 0714) Pulse Rate:  [51-54] 54 (11/09 0714) Resp:  [14-18] 17 (11/09 0714) BP: (113-127)/(70-73) 127/70 (11/09 0714) SpO2:  [99 %] 99 % (11/09 0714) Last BM Date : 08/20/24  Intake/Output from previous day: 11/08 0701 - 11/09 0700 In: 1258.8 [I.V.:1258.8] Out: 450 [Urine:400; Drains:50] Intake/Output this shift: No intake/output data recorded.  Physical exam:  Anxious in no acute distress alert able to carry conversation Abd: soft, incisions c/d/I no infection or peritonitis Serous output    Lab Results: CBC  Recent Labs    08/19/24 0612 08/20/24 0824  WBC 7.0 7.6  HGB 11.4* 11.3*  HCT 33.4* 33.4*  PLT 255 322   BMET Recent Labs    08/19/24 0612 08/20/24 0824  NA 138 138  K 4.3 4.5  CL 110 106  CO2 21* 24  GLUCOSE 106* 98  BUN 22* 24*  CREATININE 0.66 0.74  CALCIUM 8.1* 8.3*   PT/INR No results for input(s): LABPROT, INR in the last 72 hours. ABG No results for input(s): PHART, HCO3 in the last 72 hours.  Invalid input(s): PCO2, PO2  Studies/Results: No results found.  Anti-infectives: Anti-infectives (From admission, onward)    Start     Dose/Rate Route Frequency Ordered Stop   08/13/24 2200  piperacillin-tazobactam (ZOSYN) IVPB 3.375 g        3.375 g 12.5 mL/hr over 240 Minutes Intravenous Every 8 hours 08/13/24 1551 08/18/24 2359   08/13/24 1530  fluconazole (DIFLUCAN) IVPB 400 mg  Status:  Discontinued        400 mg 100 mL/hr over 120 Minutes Intravenous  Once 08/13/24 1428 08/18/24 1018   08/13/24 1345  piperacillin-tazobactam (ZOSYN) IVPB 3.375 g        3.375 g 100  mL/hr over 30 Minutes Intravenous  Once 08/13/24 1336 08/13/24 1428       Assessment/Plan: Doing well Dc in am I will let Dr Marinda manage his drain but from my perspective he does not need it No evidence of complicaitons  Laneta Luna, MD, FACS  08/20/2024

## 2024-08-20 NOTE — Plan of Care (Signed)
  Problem: Education: Goal: Knowledge of General Education information will improve Description: Including pain rating scale, medication(s)/side effects and non-pharmacologic comfort measures Outcome: Progressing   Problem: Health Behavior/Discharge Planning: Goal: Ability to manage health-related needs will improve Outcome: Progressing   Problem: Clinical Measurements: Goal: Ability to maintain clinical measurements within normal limits will improve Outcome: Progressing Goal: Will remain free from infection Outcome: Progressing Goal: Diagnostic test results will improve Outcome: Progressing Goal: Respiratory complications will improve Outcome: Progressing Goal: Cardiovascular complication will be avoided Outcome: Progressing   Problem: Activity: Goal: Risk for activity intolerance will decrease Outcome: Progressing   Problem: Nutrition: Goal: Adequate nutrition will be maintained Outcome: Progressing   Problem: Coping: Goal: Level of anxiety will decrease Outcome: Progressing   Problem: Elimination: Goal: Will not experience complications related to bowel motility Outcome: Progressing Goal: Will not experience complications related to urinary retention Outcome: Progressing   Problem: Pain Managment: Goal: General experience of comfort will improve and/or be controlled Outcome: Progressing   Problem: Skin Integrity: Goal: Risk for impaired skin integrity will decrease Outcome: Progressing   Problem: Safety: Goal: Ability to remain free from injury will improve Outcome: Progressing   Problem: Education: Goal: Ability to describe self-care measures that may prevent or decrease complications (Diabetes Survival Skills Education) will improve Outcome: Progressing Goal: Individualized Educational Video(s) Outcome: Progressing   Problem: Coping: Goal: Ability to adjust to condition or change in health will improve Outcome: Progressing   Problem: Fluid  Volume: Goal: Ability to maintain a balanced intake and output will improve Outcome: Progressing   Problem: Health Behavior/Discharge Planning: Goal: Ability to identify and utilize available resources and services will improve Outcome: Progressing Goal: Ability to manage health-related needs will improve Outcome: Progressing   Problem: Metabolic: Goal: Ability to maintain appropriate glucose levels will improve Outcome: Progressing   Problem: Nutritional: Goal: Maintenance of adequate nutrition will improve Outcome: Progressing Goal: Progress toward achieving an optimal weight will improve Outcome: Progressing   Problem: Skin Integrity: Goal: Risk for impaired skin integrity will decrease Outcome: Progressing   Problem: Tissue Perfusion: Goal: Adequacy of tissue perfusion will improve Outcome: Progressing

## 2024-08-21 ENCOUNTER — Other Ambulatory Visit: Payer: Self-pay

## 2024-08-21 DIAGNOSIS — K668 Other specified disorders of peritoneum: Secondary | ICD-10-CM | POA: Diagnosis not present

## 2024-08-21 DIAGNOSIS — K255 Chronic or unspecified gastric ulcer with perforation: Secondary | ICD-10-CM | POA: Diagnosis not present

## 2024-08-21 DIAGNOSIS — R1084 Generalized abdominal pain: Secondary | ICD-10-CM | POA: Diagnosis not present

## 2024-08-21 DIAGNOSIS — F1010 Alcohol abuse, uncomplicated: Secondary | ICD-10-CM | POA: Diagnosis not present

## 2024-08-21 LAB — CBC WITH DIFFERENTIAL/PLATELET
Abs Immature Granulocytes: 0.04 K/uL (ref 0.00–0.07)
Basophils Absolute: 0.1 K/uL (ref 0.0–0.1)
Basophils Relative: 1 %
Eosinophils Absolute: 0.4 K/uL (ref 0.0–0.5)
Eosinophils Relative: 5 %
HCT: 34.4 % — ABNORMAL LOW (ref 39.0–52.0)
Hemoglobin: 11.4 g/dL — ABNORMAL LOW (ref 13.0–17.0)
Immature Granulocytes: 1 %
Lymphocytes Relative: 33 %
Lymphs Abs: 2.5 K/uL (ref 0.7–4.0)
MCH: 33.3 pg (ref 26.0–34.0)
MCHC: 33.1 g/dL (ref 30.0–36.0)
MCV: 100.6 fL — ABNORMAL HIGH (ref 80.0–100.0)
Monocytes Absolute: 0.8 K/uL (ref 0.1–1.0)
Monocytes Relative: 10 %
Neutro Abs: 3.9 K/uL (ref 1.7–7.7)
Neutrophils Relative %: 50 %
Platelets: 380 K/uL (ref 150–400)
RBC: 3.42 MIL/uL — ABNORMAL LOW (ref 4.22–5.81)
RDW: 12.1 % (ref 11.5–15.5)
Smear Review: NORMAL
WBC: 7.7 K/uL (ref 4.0–10.5)
nRBC: 0 % (ref 0.0–0.2)

## 2024-08-21 LAB — BASIC METABOLIC PANEL WITH GFR
Anion gap: 11 (ref 5–15)
BUN: 22 mg/dL — ABNORMAL HIGH (ref 6–20)
CO2: 24 mmol/L (ref 22–32)
Calcium: 8.2 mg/dL — ABNORMAL LOW (ref 8.9–10.3)
Chloride: 102 mmol/L (ref 98–111)
Creatinine, Ser: 0.73 mg/dL (ref 0.61–1.24)
GFR, Estimated: >60 mL/min (ref >60)
Glucose, Bld: 98 mg/dL (ref 70–99)
Potassium: 4.1 mmol/L (ref 3.5–5.1)
Sodium: 137 mmol/L (ref 135–145)

## 2024-08-21 LAB — GLUCOSE, CAPILLARY: Glucose-Capillary: 88 mg/dL (ref 70–99)

## 2024-08-21 MED ORDER — OXYCODONE HCL 5 MG PO TABS
5.0000 mg | ORAL_TABLET | Freq: Four times a day (QID) | ORAL | Status: DC | PRN
  Administered 2024-08-21: 5 mg via ORAL
  Filled 2024-08-21: qty 1

## 2024-08-21 MED ORDER — PANTOPRAZOLE SODIUM 40 MG PO TBEC
40.0000 mg | DELAYED_RELEASE_TABLET | Freq: Two times a day (BID) | ORAL | 0 refills | Status: AC
  Filled 2024-08-21: qty 60, 30d supply, fill #0

## 2024-08-21 MED ORDER — ADULT MULTIVITAMIN W/MINERALS CH
1.0000 | ORAL_TABLET | Freq: Every day | ORAL | Status: DC
  Administered 2024-08-21: 1 via ORAL
  Filled 2024-08-21: qty 1

## 2024-08-21 MED ORDER — ENSURE PLUS HIGH PROTEIN PO LIQD
237.0000 mL | Freq: Three times a day (TID) | ORAL | Status: DC
  Administered 2024-08-21: 237 mL via ORAL

## 2024-08-21 MED ORDER — ACETAMINOPHEN 325 MG PO TABS
650.0000 mg | ORAL_TABLET | Freq: Four times a day (QID) | ORAL | 0 refills | Status: AC | PRN
  Filled 2024-08-21: qty 30, 4d supply, fill #0

## 2024-08-21 MED ORDER — OXYCODONE HCL 5 MG PO TABS
5.0000 mg | ORAL_TABLET | Freq: Four times a day (QID) | ORAL | 0 refills | Status: AC | PRN
  Filled 2024-08-21: qty 12, 3d supply, fill #0

## 2024-08-21 MED ORDER — ACETAMINOPHEN 325 MG PO TABS
650.0000 mg | ORAL_TABLET | Freq: Four times a day (QID) | ORAL | Status: DC | PRN
Start: 2024-08-21 — End: 2024-08-21
  Administered 2024-08-21: 650 mg via ORAL
  Filled 2024-08-21: qty 2

## 2024-08-21 NOTE — Progress Notes (Addendum)
 CC: pod # 7 Subjective:  Patient doing better today. Toelrated fld, continues to have bowel function and drain is serous. Some pain yesterday but improved today.    Objective: Vital signs in last 24 hours: Temp:  [98.1 F (36.7 C)-98.4 F (36.9 C)] 98.3 F (36.8 C) (11/10 0432) Pulse Rate:  [55-60] 60 (11/10 0432) Resp:  [18-19] 18 (11/10 0432) BP: (102-123)/(61-73) 112/66 (11/10 0432) SpO2:  [99 %] 99 % (11/10 0432) Weight:  [57.1 kg] 57.1 kg (11/10 0500) Last BM Date : 08/20/24  Intake/Output from previous day: 11/09 0701 - 11/10 0700 In: -  Out: 722 [Urine:700; Drains:22] Intake/Output this shift: No intake/output data recorded.  Physical exam:  Anxious in no acute distress alert able to carry conversation Abd: soft, incisions c/d/I no infection or peritonitis Serous output    Lab Results: CBC  Recent Labs    08/19/24 0612 08/20/24 0824  WBC 7.0 7.6  HGB 11.4* 11.3*  HCT 33.4* 33.4*  PLT 255 322   BMET Recent Labs    08/19/24 0612 08/20/24 0824  NA 138 138  K 4.3 4.5  CL 110 106  CO2 21* 24  GLUCOSE 106* 98  BUN 22* 24*  CREATININE 0.66 0.74  CALCIUM 8.1* 8.3*   PT/INR No results for input(s): LABPROT, INR in the last 72 hours. ABG No results for input(s): PHART, HCO3 in the last 72 hours.  Invalid input(s): PCO2, PO2  Studies/Results: No results found.  Anti-infectives: Anti-infectives (From admission, onward)    Start     Dose/Rate Route Frequency Ordered Stop   08/13/24 2200  piperacillin-tazobactam (ZOSYN) IVPB 3.375 g        3.375 g 12.5 mL/hr over 240 Minutes Intravenous Every 8 hours 08/13/24 1551 08/18/24 2359   08/13/24 1530  fluconazole (DIFLUCAN) IVPB 400 mg  Status:  Discontinued        400 mg 100 mL/hr over 120 Minutes Intravenous  Once 08/13/24 1428 08/18/24 1018   08/13/24 1345  piperacillin-tazobactam (ZOSYN) IVPB 3.375 g        3.375 g 100 mL/hr over 30 Minutes Intravenous  Once 08/13/24 1336 08/13/24 1428        Assessment/Plan: Drain removed Advance to soft diet Can d/c Follow up in my office in two weeks, will need EGD in 6-8 weeks Needs to be on PPI BID, avoid NSAIDs  Jayson Endow, M.D. [facility]

## 2024-08-21 NOTE — Progress Notes (Signed)
 Nutrition Follow-up  DOCUMENTATION CODES:   Severe malnutrition in context of chronic illness  INTERVENTION:   -Continue soft diet -Ensure Plus High Protein po TID, each supplement provides 350 kcal and 20 grams of protein  -Magic cup TID with meals, each supplement provides 290 kcal and 9 grams of protein  -MVI with minerals daily  -Continue 100 mg thiamine  daily   NUTRITION DIAGNOSIS:   Severe Malnutrition related to chronic illness (COPD, etoh abuse) as evidenced by severe fat depletion, severe muscle depletion.  Ongoing  GOAL:   Patient will meet greater than or equal to 90% of their needs  Progressing   MONITOR:   Diet advancement, Labs, Weight trends, Skin, I & O's, Other (Comment) (TPN)  REASON FOR ASSESSMENT:   Consult New TPN/TNA  ASSESSMENT:   58 y/o male with h/o anxiety, depression, tremor, substance abuse, etoh abuse, DDD, chronic pain, inguinal hernia repair, HTN, GERD and COPD who is admitted with perforated prepyloric gastric ulcer now s/p diagnostic laparoscopy with robotic jejunal serosal patch repair 11/2.  11/2- s/p Procedure: Diagnostic laparoscopy, robotic jejunal serosal patch repair of perforated gastric ulcer  11/3- PICC placed, TPN initiated 11/6- s/p UGI- no leak 11/7- NGT d/c, advanced to clear liquid diet 11/8- advanced to full liquid diet 11/9- TPN d/c 11/10- advanced to soft diet  Reviewed I/O's: -722 ml x 24 hours and +2.4 L since admission  UOP: 700 ml x 24 hours   Drain output: 22 ml x 24 hours  Patient has been eating some meals, however, no 10-0%. No meal completion data available to assess at this time. Per general surgery notes, likely plan to d/c today.   Weight has been stable over the past week.   Medications reviewed and include protonix , lovenox , and thiamine .   Labs reviewed: CBGS: 69-158 (inpatient orders for glycemic control are 0-9 units insulin aspart every 4 hours).    Diet Order:   Diet Order              DIET SOFT Room service appropriate? Yes; Fluid consistency: Thin  Diet effective now                   EDUCATION NEEDS:   Education needs have been addressed  Skin:  Skin Assessment: Skin Integrity Issues: Skin Integrity Issues:: Incisions Incisions: ulbilicus and abdomen  Last BM:  08/20/24  Height:   Ht Readings from Last 1 Encounters:  08/13/24 6' (1.829 m)    Weight:   Wt Readings from Last 1 Encounters:  08/21/24 57.1 kg    Ideal Body Weight:  80.9 kg  BMI:  Body mass index is 17.07 kg/m.  Estimated Nutritional Needs:   Kcal:  1900-2200kcal/day  Protein:  95-110g/day  Fluid:  1.9-2.2L/day    Margery ORN, RD, LDN, CDCES Registered Dietitian III Certified Diabetes Care and Education Specialist If unable to reach this RD, please use RD Inpatient group chat on secure chat between hours of 8am-4 pm daily

## 2024-08-21 NOTE — Progress Notes (Incomplete)
 Progress Note   Patient: David Sherman FMW:[id number] DOB: 11/03/1965 DOA: 08/13/2024     8 DOS: the patient was seen and examined on 08/21/2024    Brief hospital course:   David Sherman is a 58 y.o. male with history of hypertension, GERD, depression, anxiety, restless leg syndrome, chronic alcohol use with likely dependence, presents to the emergency department for chief concerns of abdominal pain. CT abdomen pelvis with contrast: Was read as pneumoperitoneum in the epigastric region, likely related to perforated distal gastric ulcer. Patient was taken emergently to the OR for acute abdomen in setting of the above.    Underwent jejunal patch by general surgery on 11/2.  Tolerated well.  Started on TPN postoperatively.       Assessment & Plan:   Principal Problem:   Perforated gastric ulcer (HCC) Active Problems:   Pneumoperitoneum   Alcohol abuse   Essential hypertension   COPD (chronic obstructive pulmonary disease) (HCC)   GERD (gastroesophageal reflux disease)   Protein-calorie malnutrition, severe  Alcohol abuse Continue CIWA   Pneumoperitoneum Secondary to possible perforated distal gastric ulcer General Surgery is aware and has taken patient emergently to the OR Patient now status post jejunal patch Plan: NG tube removed on 08/18/2024 Diet advanced by general surgeon Patient received a course of IV Zosyn Continue to wean off TPN per surgeon recommendation Advancement of diet as tolerated   COPD (chronic obstructive pulmonary disease) (HCC) Not acutely exacerbated.  Continue as needed nebs   Essential hypertension Continue Home lisinopril Continue     Cavitary lung lesion  spiculated lesion noted on chest x-ray.  Follow-up chest CT with IV contrast demonstrates a cavitary lesion with thickened walls in the peripheral left upper lobe measuring 3.4 x 1.5 cm.  Recommend PET/CT and tissue sampling once patient is medically stable     DVT  prophylaxis: SQ Lovenox  Code Status: Full Family Communication: None Disposition Plan: Status is: Inpatient Remains inpatient appropriate because: Perforated gastric ulcer     Level of care: Med-Surg   Consultants:  Surgery   Procedures:  Ex lap   Subjective:  No acute overnight events Diet advanced by general surgeon Patient still on TPN Surgeon hoping for possible discharge tomorrow    Physical Exam:   General exam: Appears weak and deconditioned Respiratory system: Scattered crackles bilaterally.  Normal work of breathing.  Room air Cardiovascular system: S1-S2, RRR, no murmurs, no pedal edema Gastrointestinal system: Midline surgical incision, incision with a drain in place Central nervous system: Alert and oriented. No focal neurological deficits. Extremities: Symmetric 5 x 5 power. Skin: No rashes, lesions or ulcers Psychiatry: Judgement and insight appear normal. Mood & affect appropriate.    Data reviewed:    Latest Ref Rng & Units 08/20/2024    8:24 AM 08/19/2024    6:12 AM 08/18/2024    5:55 AM  CBC  WBC 4.0 - 10.5 K/uL 7.6  7.0  5.8   Hemoglobin 13.0 - 17.0 g/dL 88.6  88.5  88.9   Hematocrit 39.0 - 52.0 % 33.4  33.4  32.7   Platelets 150 - 400 K/uL 322  255  262        Latest Ref Rng & Units 08/20/2024    8:24 AM 08/19/2024    6:12 AM 08/18/2024    5:55 AM  BMP  Glucose 70 - 99 mg/dL 98  893  891   BUN 6 - 20 mg/dL 24  22  29    Creatinine 0.61 -  1.24 mg/dL 9.25  9.33  9.33   Sodium 135 - 145 mmol/L 138  138  142   Potassium 3.5 - 5.1 mmol/L 4.5  4.3  3.9   Chloride 98 - 111 mmol/L 106  110  111   CO2 22 - 32 mmol/L 24  21  26    Calcium 8.9 - 10.3 mg/dL 8.3  8.1  8.0       Vitals:   08/20/24 1507 08/20/24 2232 08/21/24 0432 08/21/24 0500  BP: 123/73 102/61 112/66   Pulse: (!) 55 (!) 57 60   Resp: 19 18 18    Temp: 98.4 F (36.9 C) 98.1 F (36.7 C) 98.3 F (36.8 C)   TempSrc:      SpO2: 99% 99% 99%   Weight:    57.1 kg  Height:          Author: Marien LITTIE Piety, MD 08/21/2024 7:51 AM  For on call review [email or web address].

## 2024-08-21 NOTE — Care Plan (Signed)
 PICC removed by this RN Patient kept flat for 15 minutes Pressure held, no symptoms present No signs of bleeding Education provided to patient, verbalized understanding

## 2024-08-21 NOTE — Discharge Instructions (Signed)
   Please continue a soft food and bland diet while you are healing Follow up with general surgery office in two weeks, will need EGD in 6-8 weeks Take your protonix  twice daily Do not take NSAID medications- ibuprofen , advil , etc Tylenol  is safe

## 2024-08-21 NOTE — Plan of Care (Signed)
  Problem: Clinical Measurements: Goal: Diagnostic test results will improve Outcome: Progressing   Problem: Activity: Goal: Risk for activity intolerance will decrease Outcome: Progressing   Problem: Coping: Goal: Level of anxiety will decrease Outcome: Progressing   Problem: Pain Managment: Goal: General experience of comfort will improve and/or be controlled Outcome: Progressing   Problem: Metabolic: Goal: Ability to maintain appropriate glucose levels will improve Outcome: Progressing

## 2024-08-21 NOTE — Discharge Summary (Signed)
 Physician Discharge Summary  Patient: David Sherman FMW:[id number] DOB: Nov 20, 1965   Code Status: Full Code Admit date: 08/13/2024 Discharge date: 08/21/2024 Disposition: Home, No home health services recommended PCP: Delfina Pao, MD  Recommendations for Outpatient Follow-up:  Follow up with PCP within 1-2 weeks Regarding general hospital follow up and preventative care F/u general surgery 2-3 weeks  Discharge Diagnoses:  Principal Problem:   Perforated gastric ulcer (HCC) Active Problems:   Pneumoperitoneum   Alcohol abuse   Essential hypertension   COPD (chronic obstructive pulmonary disease) (HCC)   GERD (gastroesophageal reflux disease)   Protein-calorie malnutrition, severe  Brief Hospital Course Summary: David Sherman is a 58 y.o. male with history of hypertension, GERD, depression, anxiety, restless leg syndrome, chronic alcohol use with likely dependence, presents to the emergency department for chief concerns of abdominal pain. CT abdomen pelvis with contrast: Was read as pneumoperitoneum in the epigastric region, likely related to perforated distal gastric ulcer. Patient was taken emergently to the OR for acute abdomen in setting of the above.  Underwent jejunal patch by general surgery on 11/2.  Tolerated well.  Post-op recovery complicated by ileus. Had NG tube and started on TPN postoperatively.  He was gradually advanced with PO diet and TPN weaned off. NG tube removed 11/7. Was treated with zosyn empirically and completed antibiotic course prior to dc.  Of note additionally found: Cavitary lung lesion  spiculated lesion noted on chest x-ray.  Follow-up chest CT with IV contrast demonstrates a cavitary lesion with thickened walls in the peripheral left upper lobe measuring 3.4 x 1.5 cm.  Recommend PET/CT and tissue sampling once patient is medically stable. Outpatient oncology referral was placed.   All other chronic conditions were treated with home  medications.    Discharge Condition: Good, improved Recommended discharge diet: Regular healthy diet  Consultations: General surgery   Procedures/Studies: Ex lap with jejunal patch  TPN  Discharge Instructions     Discharge patient   Complete by: As directed    Discharge disposition: 01-Home or Self Care   Discharge patient date: 08/21/2024      Allergies as of 08/21/2024       Reactions   Hydrocodone-acetaminophen  Nausea And Vomiting   Tramadol-acetaminophen  Nausea And Vomiting   Ibuprofen     Pt avoids ibuprofen  GI upset        Medication List     STOP taking these medications    albuterol  108 (90 Base) MCG/ACT inhaler Commonly known as: VENTOLIN  HFA   citalopram  20 MG tablet Commonly known as: CELEXA    lisinopril 10 MG tablet Commonly known as: ZESTRIL   omeprazole 40 MG capsule Commonly known as: PRILOSEC   rOPINIRole 0.5 MG tablet Commonly known as: REQUIP       TAKE these medications    acetaminophen  325 MG tablet Commonly known as: TYLENOL  Take 2 tablets (650 mg total) by mouth every 6 (six) hours as needed for mild pain (pain score 1-3).   budesonide-formoterol  160-4.5 MCG/ACT inhaler Commonly known as: SYMBICORT Inhale 2 puffs into the lungs 2 (two) times a day.   oxyCODONE  5 MG immediate release tablet Commonly known as: Oxy IR/ROXICODONE  Take 1 tablet (5 mg total) by mouth every 6 (six) hours as needed for up to 3 days for severe pain (pain score 7-10).   pantoprazole  40 MG tablet Commonly known as: PROTONIX  Take 1 tablet (40 mg total) by mouth 2 (two) times daily.        Follow-up Information  Marinda Jayson KIDD, MD. Schedule an appointment as soon as possible for a visit in 2 week(s).   Specialty: General Surgery Contact information: [address] [place] [address] [phone]                Subjective   Pt reports tolerating diet well. Denies abdominal pain. No nausea.   All questions and  concerns were addressed at time of discharge.  Objective  Blood pressure 113/69, pulse 60, temperature 98.3 F (36.8 C), resp. rate 18, height 6' (1.829 m), weight 57.1 kg, SpO2 99%.   General: Pt is alert, awake, not in acute distress Cardiovascular: RRR, S1/S2 +, no rubs, no gallops Respiratory: CTA bilaterally, no wheezing, no rhonchi Abdominal: Soft, NT, surgical incision healing well Extremities: no edema, no cyanosis  The results of significant diagnostics from this hospitalization (including imaging, microbiology, ancillary and laboratory) are listed below for reference.   Imaging studies: DG UGI W SINGLE CM (SOL OR THIN BA) Result Date: 08/17/2024 CLINICAL DATA:  58 year old male post-operative day 4 status post diagnostic laparotomy with jejunal serosal patch repair of perforated gastric ulcer. IR was requested for modified UGI to rule out gastric leak. EXAM: DG UGI W SINGLE CM TECHNIQUE: Single contrast examination was performed using Omnipaque 180. This exam was performed by Carlin Griffon, PA-C, and was supervised and interpreted by Harrietta Sherry, MD. FLUOROSCOPY: Radiation Exposure Index (as provided by the fluoroscopic device): 33.8 mGy Kerma COMPARISON:  CT AP with contrast on 08/13/2024. FINDINGS: Esophagus:  Not evaluated. Esophageal motility:  Not evaluated. Gastroesophageal reflux:  Not evaluated. Ingested 13mm barium tablet: Not given. Stomach: NGT in place.  Normal appearance. No hiatal hernia. Gastric emptying: Unremarkable. Duodenum:  Unremarkable. Other:  No evidence of extraluminal contrast extravasation. IMPRESSION: No evidence of extraluminal contrast extravasation identified. Electronically Signed   By: Harrietta Sherry M.D.   On: 08/17/2024 13:47   DG Chest Port 1 View Result Date: 08/15/2024 CLINICAL DATA:  NG placement. EXAM: PORTABLE CHEST 1 VIEW COMPARISON:  Chest radiograph dated 08/14/2024. FINDINGS: Right-sided PICC with tip at the cavoatrial junction. Enteric  tube extends below diaphragm with tip beyond the inferior margin of the image. Emphysema. Similar appearance of a 3 cm left upper lobe mass. No new consolidation. There is no pleural effusion or pneumothorax. The cardiac silhouette is within normal limits. No acute osseous pathology. IMPRESSION: 1. Enteric tube extends below diaphragm with tip beyond the inferior margin of the image. 2. No acute cardiopulmonary process. 3. Emphysema and left upper lobe mass. Electronically Signed   By: Vanetta Chou M.D.   On: 08/15/2024 19:12   CT CHEST W CONTRAST Result Date: 08/15/2024 EXAM: CT CHEST WITH CONTRAST 08/15/2024 12:12:36 PM TECHNIQUE: CT of the chest was performed with the administration of intravenous contrast. Multiplanar reformatted images are provided for review. Automated exposure control, iterative reconstruction, and/or weight based adjustment of the mA/kV was utilized to reduce the radiation dose to as low as reasonably achievable. CONTRAST: 75 mL of Omnipaque 300. COMPARISON: None available. CLINICAL HISTORY: Abnormal x ray - lung nodule, >= 1 cm. body onc FINDINGS: MEDIASTINUM: Heart and pericardium are unremarkable. The central airways are clear. LYMPH NODES: No mediastinal, hilar or axillary lymphadenopathy. LUNGS AND PLEURA: Within the periphery of the left upper lobe, there is a cavitary lesion with thickened walls measuring 3.4 x 1.5 cm. On coronal imaging there is a clip along the medial margin (image 72 of series 5). This cavitary lesion abuts the pleural surface. Centrilobular  emphysema in the upper lobes. Lungs are hyperinflated. No pulmonary edema. No pleural effusion or pneumothorax. SOFT TISSUES/BONES: No acute abnormality of the bones or soft tissues. UPPER ABDOMEN: Limited images of the upper abdomen demonstrate the feeding tube extends into the stomach. The adrenal glands are normal. No other acute abnormality is seen in the limited upper abdomen. IMPRESSION: 1. Cavitary lesion with  thickened walls in the peripheral left upper lobe measuring 3.4 x 1.5 cm. Recommend PET/CT and tissue sampling for further evaluation given size and morphology. 2. Centrilobular emphysema in the upper lobes Electronically signed by: Norleen Boxer MD 08/15/2024 03:10 PM EST RP Workstation: [id number]   DG Chest Port 1 View Result Date: 08/15/2024 EXAM: 1 VIEW(S) XRAY OF THE CHEST 08/14/2024 01:38:40 PM COMPARISON: 03/10/2019 CLINICAL HISTORY: Cough FINDINGS: LINES, TUBES AND DEVICES: There is an enteric tube with tip in the expected location of the proximal gastric fundus. The side hole is 2.2 cm above the hemidiaphragms. LUNGS AND PLEURA: Spiculated nodule in the periphery of the left upper lobe measures 3 cm. No pulmonary edema. No pleural effusion. No pneumothorax. HEART AND MEDIASTINUM: No acute abnormality of the cardiac and mediastinal silhouettes. BONES AND SOFT TISSUES: No acute osseous abnormality. IMPRESSION: 1. Spiculated 3 cm left upper lobe pulmonary nodule, suspicious for malignancy. Recommend more definitive characterization with dedicated CT of the chest. 2. The side hole for the enteric tube is above the ge junction. Recommend advancement for placement 3. The urgent finding will be called to the ordering provider by the [facility]ist[employer] (PRAs) and documented in the [facility] dashboard. . Electronically signed by: Waddell Calk MD 08/15/2024 08:53 AM EST RP Workstation: [id number]   US  EKG SITE RITE Result Date: 08/14/2024 If Site Rite image not attached, placement could not be confirmed due to current cardiac rhythm.  DG Abd 1 View Result Date: 08/13/2024 EXAM: 1 VIEW XRAY OF THE ABDOMEN 08/13/2024 07:16:00 PM COMPARISON: None available. CLINICAL HISTORY: [id number] Encounter for nasogastric (NG) tube placement [id number] Encounter for nasogastric (NG) tube placement FINDINGS: LINES, TUBES AND DEVICES: Enteric tube coursing below the hemidiaphragm with tip overlying the expected region  of the gastric lumen and side port overlying the expected gastroesophageal. Weighted enteric tube with the tip overlying the expected region of the gastric lumen. A surgical drain overlies the abdomen. BOWEL: Nonobstructive bowel gas pattern. SOFT TISSUES: No opaque urinary calculi. BONES: No acute osseous abnormality. IMPRESSION: 1. Enteric tube coursing below the hemidiaphragm with tip overlying the expected region of the gastric lumen and side port overlying the expected gastroesophageal. 2. Weighted enteric tube with the tip overlying the expected region of the gastric lumen. 3. Surgical drain projects over the abdomen. Electronically signed by: Morgane Naveau MD 08/13/2024 07:22 PM EST RP Workstation: [id number]   CT ABDOMEN PELVIS W CONTRAST Result Date: 08/13/2024 EXAM: CT ABDOMEN AND PELVIS WITH CONTRAST 08/13/2024 01:16:25 PM TECHNIQUE: CT of the abdomen and pelvis was performed with the administration of 100 mL of iohexol (OMNIPAQUE) 300 MG/ML solution. Multiplanar reformatted images are provided for review. Automated exposure control, iterative reconstruction, and/or weight-based adjustment of the mA/kV was utilized to reduce the radiation dose to as low as reasonably achievable. COMPARISON: None available. CLINICAL HISTORY: Abdominal pain, acute, nonlocalized. FINDINGS: LOWER CHEST: No acute abnormality. LIVER: Hepatic steatosis. GALLBLADDER AND BILE DUCTS: Gallbladder is unremarkable. No biliary ductal dilatation. SPLEEN: No acute abnormality. PANCREAS: No acute abnormality. ADRENAL GLANDS: No acute abnormality. KIDNEYS, URETERS AND BLADDER: No stones in the kidneys or ureters. No hydronephrosis.  No perinephric or periureteral stranding. Urinary bladder is unremarkable. GI AND BOWEL: Pneumoperitoneum is noted in the epigastric region which appears to be most likely due to perforated distal gastric ulcer. There is no bowel obstruction. PERITONEUM AND RETROPERITONEUM: Pneumoperitoneum is noted in the  epigastric region which appears to be most likely due to perforated distal gastric ulcer. No ascites. VASCULATURE: Aortic atherosclerosis. Aorta is normal in caliber. LYMPH NODES: No lymphadenopathy. REPRODUCTIVE ORGANS: No acute abnormality. BONES AND SOFT TISSUES: No acute osseous abnormality. No focal soft tissue abnormality. IMPRESSION: 1. Pneumoperitoneum in the epigastric region, likely related to a perforated distal gastric ulcer. This finding was discussed with Devere Perry, PA, at 01:30 pm on 08/13/2024. 2. Hepatic steatosis. Electronically signed by: Lynwood Seip MD 08/13/2024 01:31 PM EST RP Workstation: [id number]    Labs: Basic Metabolic Panel: Recent Labs  Lab 08/15/24 9341 08/16/24 0503 08/17/24 9485 08/18/24 0555 08/19/24 0612 08/20/24 0824 08/21/24 0731  NA 139 138 140 142 138 138 137  K 4.0 3.8 3.9 3.9 4.3 4.5 4.1  CL 105 107 107 111 110 106 102  CO2 27 24 26 26  21* 24 24  GLUCOSE 94 118* 108* 108* 106* 98 98  BUN 27* 21* 24* 29* 22* 24* 22*  CREATININE 1.01 0.85 0.84 0.66 0.66 0.74 0.73  CALCIUM 7.8* 7.8* 8.0* 8.0* 8.1* 8.3* 8.2*  MG 2.4 2.1 2.0  --   --   --   --   PHOS 2.2* 3.0 3.8 3.6  --   --   --    CBC: Recent Labs  Lab 08/18/24 0555 08/19/24 0612 08/20/24 0824 08/21/24 0731  WBC 5.8 7.0 7.6 7.7  NEUTROABS 3.7 4.1 4.3 3.9  HGB 11.0* 11.4* 11.3* 11.4*  HCT 32.7* 33.4* 33.4* 34.4*  MCV 99.7 99.7 98.8 100.6*  PLT 262 255 322 380   Microbiology: Results for orders placed or performed during the hospital encounter of 08/13/24  MRSA Next Gen by PCR, Nasal     Status: Abnormal   Collection Time: 08/13/24  7:53 PM   Specimen: Nasal Mucosa; Nasal Swab  Result Value Ref Range Status   MRSA by PCR Next Gen DETECTED (A) NOT DETECTED Final    Comment: RESULT CALLED TO, READ BACK BY AND VERIFIED WITH: EBONY COOPER, RN @0125  08/14/2024 COP (NOTE) The GeneXpert MRSA Assay (FDA approved for NASAL specimens only), is one component of a comprehensive MRSA  colonization surveillance program. It is not intended to diagnose MRSA infection nor to guide or monitor treatment for MRSA infections. Test performance is not FDA approved in patients less than 61 years old. Performed at [facility], [address] [place], [place] [address]     Time coordinating discharge: Over 30 minutes  Marien LITTIE Piety, MD  [facility] 08/21/2024, 10:42 AM

## 2024-08-21 NOTE — Plan of Care (Signed)

## 2024-09-05 ENCOUNTER — Encounter: Admitting: General Surgery
